# Patient Record
Sex: Male | Born: 1935 | Race: White | Hispanic: No | Marital: Married | Smoking: Former smoker
Health system: Southern US, Community
[De-identification: ages and names within clinical notes are randomized; demographics above are authoritative.]

## PROBLEM LIST (undated history)

## (undated) DIAGNOSIS — E785 Hyperlipidemia, unspecified: Secondary | ICD-10-CM

## (undated) DIAGNOSIS — I10 Essential (primary) hypertension: Secondary | ICD-10-CM

## (undated) DIAGNOSIS — Z8673 Personal history of transient ischemic attack (TIA), and cerebral infarction without residual deficits: Secondary | ICD-10-CM

## (undated) HISTORY — PX: KNEE ARTHROSCOPY: SHX127

## (undated) HISTORY — PX: INGUINAL HERNIA REPAIR: SHX194

## (undated) HISTORY — DX: Essential (primary) hypertension: I10

## (undated) HISTORY — PX: TRANSURETHRAL RESECTION OF PROSTATE: SHX73

## (undated) HISTORY — DX: Hyperlipidemia, unspecified: E78.5

## (undated) HISTORY — DX: Personal history of transient ischemic attack (TIA), and cerebral infarction without residual deficits: Z86.73

---

## 1999-06-15 ENCOUNTER — Ambulatory Visit (HOSPITAL_COMMUNITY): Admission: RE | Admit: 1999-06-15 | Discharge: 1999-06-15 | Payer: Self-pay | Admitting: Family Medicine

## 1999-06-15 ENCOUNTER — Encounter: Payer: Self-pay | Admitting: Family Medicine

## 2000-03-03 ENCOUNTER — Ambulatory Visit (HOSPITAL_COMMUNITY): Admission: RE | Admit: 2000-03-03 | Discharge: 2000-03-03 | Payer: Self-pay | Admitting: Family Medicine

## 2000-03-03 ENCOUNTER — Encounter: Payer: Self-pay | Admitting: Family Medicine

## 2000-06-29 ENCOUNTER — Ambulatory Visit (HOSPITAL_COMMUNITY): Admission: RE | Admit: 2000-06-29 | Discharge: 2000-06-29 | Payer: Self-pay | Admitting: Family Medicine

## 2000-06-29 ENCOUNTER — Encounter: Payer: Self-pay | Admitting: Family Medicine

## 2004-10-11 ENCOUNTER — Ambulatory Visit: Payer: Self-pay | Admitting: Internal Medicine

## 2004-12-13 ENCOUNTER — Ambulatory Visit: Payer: Self-pay | Admitting: Internal Medicine

## 2005-01-26 ENCOUNTER — Ambulatory Visit: Payer: Self-pay | Admitting: Internal Medicine

## 2005-05-04 ENCOUNTER — Ambulatory Visit: Payer: Self-pay | Admitting: Internal Medicine

## 2005-07-12 ENCOUNTER — Ambulatory Visit: Payer: Self-pay | Admitting: Gastroenterology

## 2005-07-29 ENCOUNTER — Ambulatory Visit: Payer: Self-pay | Admitting: Gastroenterology

## 2005-08-04 ENCOUNTER — Ambulatory Visit: Payer: Self-pay | Admitting: Internal Medicine

## 2005-08-17 ENCOUNTER — Ambulatory Visit: Payer: Self-pay | Admitting: Internal Medicine

## 2005-11-30 ENCOUNTER — Ambulatory Visit: Payer: Self-pay | Admitting: Internal Medicine

## 2006-03-03 ENCOUNTER — Ambulatory Visit: Payer: Self-pay | Admitting: Internal Medicine

## 2006-06-23 ENCOUNTER — Ambulatory Visit: Payer: Self-pay | Admitting: Internal Medicine

## 2006-08-04 ENCOUNTER — Ambulatory Visit: Payer: Self-pay | Admitting: Internal Medicine

## 2006-09-28 ENCOUNTER — Ambulatory Visit: Payer: Self-pay | Admitting: Internal Medicine

## 2007-01-10 ENCOUNTER — Ambulatory Visit: Payer: Self-pay | Admitting: Internal Medicine

## 2007-04-10 ENCOUNTER — Ambulatory Visit: Payer: Self-pay | Admitting: Internal Medicine

## 2007-08-03 ENCOUNTER — Ambulatory Visit: Payer: Self-pay | Admitting: Internal Medicine

## 2007-08-08 ENCOUNTER — Ambulatory Visit: Payer: Self-pay | Admitting: Internal Medicine

## 2007-08-23 ENCOUNTER — Ambulatory Visit: Payer: Self-pay | Admitting: Internal Medicine

## 2007-08-23 ENCOUNTER — Telehealth: Payer: Self-pay | Admitting: Internal Medicine

## 2007-08-25 DIAGNOSIS — J302 Other seasonal allergic rhinitis: Secondary | ICD-10-CM

## 2007-08-25 DIAGNOSIS — J4530 Mild persistent asthma, uncomplicated: Secondary | ICD-10-CM

## 2007-08-25 DIAGNOSIS — J3089 Other allergic rhinitis: Secondary | ICD-10-CM

## 2007-08-31 ENCOUNTER — Ambulatory Visit: Payer: Self-pay | Admitting: Internal Medicine

## 2007-11-15 ENCOUNTER — Ambulatory Visit: Payer: Self-pay | Admitting: Internal Medicine

## 2007-12-14 ENCOUNTER — Encounter: Payer: Self-pay | Admitting: Internal Medicine

## 2008-01-07 ENCOUNTER — Telehealth (INDEPENDENT_AMBULATORY_CARE_PROVIDER_SITE_OTHER): Payer: Self-pay | Admitting: *Deleted

## 2008-03-06 ENCOUNTER — Ambulatory Visit: Payer: Self-pay | Admitting: Internal Medicine

## 2008-06-25 ENCOUNTER — Ambulatory Visit: Payer: Self-pay | Admitting: Internal Medicine

## 2008-07-08 ENCOUNTER — Telehealth (INDEPENDENT_AMBULATORY_CARE_PROVIDER_SITE_OTHER): Payer: Self-pay | Admitting: *Deleted

## 2008-07-09 ENCOUNTER — Telehealth: Payer: Self-pay | Admitting: Internal Medicine

## 2008-08-01 ENCOUNTER — Ambulatory Visit: Payer: Self-pay | Admitting: Internal Medicine

## 2008-10-06 ENCOUNTER — Ambulatory Visit: Payer: Self-pay | Admitting: Internal Medicine

## 2009-04-22 ENCOUNTER — Ambulatory Visit: Payer: Self-pay | Admitting: Internal Medicine

## 2009-04-22 ENCOUNTER — Telehealth: Payer: Self-pay | Admitting: Internal Medicine

## 2009-07-31 ENCOUNTER — Ambulatory Visit: Payer: Self-pay | Admitting: Internal Medicine

## 2009-07-31 DIAGNOSIS — I10 Essential (primary) hypertension: Secondary | ICD-10-CM | POA: Insufficient documentation

## 2009-07-31 DIAGNOSIS — E785 Hyperlipidemia, unspecified: Secondary | ICD-10-CM | POA: Insufficient documentation

## 2009-08-17 ENCOUNTER — Telehealth: Payer: Self-pay | Admitting: Internal Medicine

## 2009-08-18 ENCOUNTER — Ambulatory Visit: Payer: Self-pay | Admitting: Internal Medicine

## 2009-10-06 ENCOUNTER — Emergency Department (HOSPITAL_COMMUNITY): Admission: EM | Admit: 2009-10-06 | Discharge: 2009-10-07 | Payer: Self-pay | Admitting: Emergency Medicine

## 2009-10-08 ENCOUNTER — Encounter: Admission: RE | Admit: 2009-10-08 | Discharge: 2009-10-08 | Payer: Self-pay | Admitting: Surgery

## 2009-11-20 ENCOUNTER — Ambulatory Visit: Payer: Self-pay | Admitting: Internal Medicine

## 2010-03-04 ENCOUNTER — Ambulatory Visit: Payer: Self-pay | Admitting: Internal Medicine

## 2010-06-09 ENCOUNTER — Ambulatory Visit: Payer: Self-pay | Admitting: Internal Medicine

## 2010-07-06 ENCOUNTER — Telehealth: Payer: Self-pay | Admitting: Internal Medicine

## 2010-07-08 ENCOUNTER — Telehealth (INDEPENDENT_AMBULATORY_CARE_PROVIDER_SITE_OTHER): Payer: Self-pay | Admitting: *Deleted

## 2010-07-16 ENCOUNTER — Telehealth (INDEPENDENT_AMBULATORY_CARE_PROVIDER_SITE_OTHER): Payer: Self-pay | Admitting: *Deleted

## 2010-07-30 ENCOUNTER — Ambulatory Visit: Payer: Self-pay | Admitting: Internal Medicine

## 2010-10-13 ENCOUNTER — Ambulatory Visit: Payer: Self-pay | Admitting: Internal Medicine

## 2010-10-21 NOTE — Progress Notes (Signed)
Summary: NEW MED/CB  Phone Note Call from Patient Call back at Home Phone [phone]   Caller: Patient Call For: YOUNG Summary of Call: WANTS SOMETHING TO REPLACE  THE ALLERGRA THAT HE WAS ON [facility] Initial call taken by: Lacinda Axon,  July 06, 2010 5:09 PM  Follow-up for Phone Call        pt states allegra is no longer covered by his insurance. Per pharmacists the only antihistamines that will be covered are xyzal or clarinex. Please advise. Carron Curie CMA  July 06, 2010 5:30 PM  per CY if insurance will cover Clarinex or xyzal then we can send rx but will not do a PA on either medication, pt willhave to get TOC. Rx for clarinex sent pt aware.Carron Curie CMA  July 06, 2010 5:39 PM     New/Updated Medications: CLARINEX 5 MG TABS (DESLORATADINE) Take 1 tablet by mouth once a day Prescriptions: CLARINEX 5 MG TABS (DESLORATADINE) Take 1 tablet by mouth once a day  #30 x 0   Entered by:   Carron Curie CMA   Authorized by:   Waymon Budge MD   Signed by:   Carron Curie CMA on 07/06/2010   Method used:   [facility]ically [facility]        [facility] [address]       [address]       [place], [place]  [address]       Ph: [phone] or [phone]       Fax: [phone]   RxID:   [id number]

## 2010-10-21 NOTE — Progress Notes (Signed)
Summary: prescription issue---requests 90 day supply instead of 30   Phone Note Call from Patient Call back at Home Phone [phone]   Caller: Spouse//[place] Call For: young Summary of Call: Wife states a 30-day supply of clarinex was called in for pt but he asked for 90-day supply, pls advise.//[facility] Initial call taken by: Darletta Moll,  July 08, 2010 1:40 PM  Follow-up for Phone Call        called and spoke with pt's wife.  wife states pt was supposed to get rx for 90 day supply on 07/06/2010 but was only given 30 day supply.  requests 90 day supply be called into pharmacy.  informed wife i would call new rx in for the 90 day supply.  Megan Reynolds LPN  July 08, 2010 3:11 PM     Prescriptions: CLARINEX 5 MG TABS (DESLORATADINE) Take 1 tablet by mouth once a day  #90 x 0   Entered by:   Arman Filter LPN   Authorized by:   Waymon Budge MD   Signed by:   Arman Filter LPN on 78/46/9629   Method used:   Telephoned to ...       [facility]  [place] [address] (retail)       [address]       [place], [place]  [address]       Ph: [phone] or [phone]       Fax: [phone]   RxID:   [id number]

## 2010-10-21 NOTE — Progress Notes (Signed)
Summary: meds clarification  Phone Note Call from Patient Call back at Home Phone [phone]   Caller: Patient Call For: young Summary of Call: pt has questions re: rx for clarinex. says when he was taking fexofenadine, he took 60mg  tab 2 x a day. clarinex rx is 5mg  once daily. call home # above or cell [phone] (try home # first).  Initial call taken by: Tivis Ringer, CNA,  July 16, 2010 2:56 PM  Follow-up for Phone Call        Spoke with patient- aware that Clarinex 5mg  should work just as well as the Fexofenadine 60mg -he should try it for at least 2 weeks and if any questions or concerns please call our office for further instructions from CDY.Reynaldo Minium CMA  July 16, 2010 3:15 PM

## 2010-10-21 NOTE — Assessment & Plan Note (Signed)
Summary: 29yr follow up/klw   Primary Provider/Referring Provider:  T[place]  CC:  yearly follow up--problems with his breathing per pt but he thinks this is all his allergies.  History of Present Illness: 08/01/08- Allergic rhinitis, asthma 1 yr f/u. Still occ wheeze, but less. Meds reviewed. Occ dry cough, somewhat seasonal but without much nasal congestion r rhinorhea. Had flu vax. Denies headache, chest pain, palpitation, edema.  July 31, 2009-  Allergic rhinitis, asthma Had flu vax and thinks more than one pneumovax. Having seasonal cough with some phlegm This Spring was better than usual. Uses Ricola lozenges. Uses Accupril for several years. Little dyspnea or sinus drip, no chest pain, occ peripheral edema if up all day or driving. We discussed CXR from 2 years ago.  July 30, 2010- Allergic rhinitis, asthma Nurse-CC: yearly follow up--problems with his breathing per pt but he thinks this is all his allergies Continues allergy vaccine. Uses Proair often as much as twice daily, partly precautionary. Continues Advair.Overall he sees no change in his  breathing over the past year. Very occasionally has needed a Z pak. .   Asthma History    Initial Asthma Severity Rating:    Age range: 12+ years    Symptoms: 0-2 days/week    Nighttime Awakenings: 0-2/month    Interferes w/ normal activity: no limitations    SABA use (not for EIB): daily    Asthma Severity Assessment: Moderate Persistent   Preventive Screening-Counseling & Management  Alcohol-Tobacco     Smoking Status: quit     Year Quit: 1953     Pack years: 2ppd x4 yrs  Allergies: 1)  ! Sulfa 2)  ! Codeine  Comments:  Nurse/Medical Assistant: The patient's medications and allergies were reviewed with the patient and were updated in the Medication and Allergy Lists.  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  Patient states former smoker as a teenager   Risk Factors: Smoking Status: quit (07/30/2010)  Review of Systems      See HPI       The patient complains of shortness of breath with activity and nasal congestion/difficulty breathing through nose.  The patient denies shortness of breath at rest, productive cough, non-productive cough, coughing up blood, chest pain, irregular heartbeats, acid heartburn, indigestion, loss of appetite, weight change, abdominal pain, difficulty swallowing, sore throat, tooth/dental problems, headaches, sneezing, itching, ear ache, rash, change in color of mucus, and fever.    Vital Signs:  Patient profile:   77 year old male Height:      73 inches Weight:      193.50 pounds BMI:     25.62 O2 Sat:      99 % on Room air Pulse rate:   66 / minute BP sitting:   132 / 84  (right arm) Cuff size:   regular  Vitals Entered By: Randell Loop CMA (July 30, 2010 11:36 AM)  O2 Sat at Rest %:  99 O2 Flow:  Room air CC: yearly follow up--problems with his breathing per pt but he thinks this is all his allergies Is Patient Diabetic?  No Pain Assessment Patient in pain? no      Comments meds updated today with pt   Physical Exam  Additional Exam:  General: A/Ox3; pleasant and cooperative, NAD, SKIN: no rash, lesions NODES: no lymphadenopathy HEENT: [place]/AT, EOM- WNL, Conjuctivae- clear, PERRLA, TM-WNL, Nose- clear, Throat- clear and wnl, dentures, Mallampati II NECK: Supple w/ fair ROM, JVD- none, normal carotid impulses w/o bruits Thyroid-   CHEST: Clear to P&A, no cough here, no rales or rhonchi HEART: RRR, no m/g/r heard ABDOMEN: Soft and nl; EAV:WUJW, nl pulses, no edema  NEURO: Grossly intact to observation      Impression & Recommendations:  Problem # 1:  ALLERGIC RHINITIS (ICD-477.9)  We discussed risk benefit and updated Epipen. Will let him try sample Patanase.  His updated medication list for this problem includes:    Clarinex 5 Mg Tabs (Desloratadine) .Marland Kitchen... Take 1 tablet by mouth once a day    Patanase 0.6 % Soln (Olopatadine hcl) .Marland Kitchen... 1-2 sprays each nostril two times a day if needed allergy  Problem # 2:  ASTHMA (ICD-493.90) Meds reviewed and refilled. He is using recue inhaler on a scheduled basis and we reiewed options. He has been stable enough that i will not ask him to change what works.  Medications Added to Medication List This Visit: 1)  Losartan Potassium 100 Mg Tabs (Losartan potassium) .... Take 1 tablet by mouth once a day 2)  Allergy Vaccine 1:10 Go  3)  Epipen 2-pak 0.3 Mg/0.19ml Devi (Epinephrine) .... For severe allergic reaction 4)  Patanase 0.6 % Soln (Olopatadine hcl) .Marland Kitchen.. 1-2 sprays each nostril two times a day if needed allergy  Other Orders: Est. Patient Level IV (11914)  Patient Instructions: 1)  Please schedule a follow-up appointment in 1 year. 2)  Continue present meds. 3)  Try sample/ script Patanase nasal antihistamine spray 4)  1-2 puffs each nostril twice daily if needed. 5)  Refills for Epipen and inhalers. Prescriptions: PROAIR HFA 108 (90 BASE) MCG/ACT  AERS (ALBUTEROL SULFATE) 2 puffs two times a day  #3 x 3   Entered and Authorized by:   Waymon Budge MD   Signed by:   Waymon Budge MD on 07/30/2010   Method used:   Print then Give to Patient   RxID:   [id number] ADVAIR DISKUS 250-50 MCG/DOSE  MISC (FLUTICASONE-SALMETEROL) 1 puff two times a day  #3 x 3   Entered and Authorized by:   Waymon Budge MD   Signed by:   Waymon Budge MD on 07/30/2010    Method used:   Print then Give to Patient   RxID:   [id number] PATANASE 0.6 % SOLN (OLOPATADINE HCL) 1-2 sprays each nostril two times a day if needed allergy  #1 x prn   Entered and Authorized by:   Waymon Budge MD   Signed by:   Waymon Budge MD on 07/30/2010   Method used:   Print then Give to Patient   RxID:   [id number] EPIPEN 2-PAK 0.3 MG/0.3ML DEVI (EPINEPHRINE) For severe allergic reaction  #1 x prn   Entered and Authorized by:   Waymon Budge MD   Signed by:   Waymon Budge MD on 07/30/2010   Method used:   Print then Give to Patient   RxID:   [id number]    Immunization History:  Influenza Immunization History:    Influenza:  historical (05/31/2010)

## 2010-10-22 ENCOUNTER — Telehealth (INDEPENDENT_AMBULATORY_CARE_PROVIDER_SITE_OTHER): Payer: Self-pay | Admitting: *Deleted

## 2010-11-04 NOTE — Progress Notes (Signed)
Summary: CLAIRINEX IS HIGH-atc na unable to leave msg  Phone Note Call from Patient Call back at [phone]   Caller: Patient Call For: YOUNG Summary of Call: INS IS GOING TO CHARGE 92 DOLLARS FOR 31 PILLS SAID THIS IS TOO HIGH THIS IS ON HIS CLAIRINEX Initial call taken by: Lacinda Axon,  October 22, 2010 2:10 PM  Follow-up for Phone Call        called and spoke with pt and he is just wanting to change from the clarinex to something much cheaper---he stated that he did buy some otc allegra--the allegra is not working as good as the clarinex..so he wanted to know what CY recs were.  please advise Randell Loop [facility]  October 22, 2010 4:46 PM   Additional Follow-up for Phone Call Additional follow up Details #1::        Otc Claritin/ loratadine  is chemically close to Clarinex and might work well. He might also like Zyrtec/ cetirizine otc. Additional Follow-up by: Waymon Budge MD,  October 25, 2010 1:31 PM    Additional Follow-up for Phone Call Additional follow up Details #2::    ATC pt back, NA and msg states that VM box has not been set up yet. WCB. Vernie Murders  October 25, 2010 2:14 PM  PT informed of Dr Roxy Cedar recommendations for OTC antihistamines. Abigail Miyamoto RN  October 26, 2010 9:10 AM

## 2010-12-06 LAB — DIFFERENTIAL
Basophils Relative: 1 % (ref 0–1)
Eosinophils Absolute: 0.1 10*3/uL (ref 0.0–0.7)
Lymphs Abs: 2.3 10*3/uL (ref 0.7–4.0)
Neutro Abs: 3.7 10*3/uL (ref 1.7–7.7)
Neutrophils Relative %: 55 % (ref 43–77)

## 2010-12-06 LAB — CBC
HCT: 47 % (ref 39.0–52.0)
Platelets: 169 10*3/uL (ref 150–400)
RDW: 13.7 % (ref 11.5–15.5)

## 2010-12-06 LAB — URINALYSIS, ROUTINE W REFLEX MICROSCOPIC
Bilirubin Urine: NEGATIVE
Hgb urine dipstick: NEGATIVE

## 2010-12-06 LAB — HEMOGLOBIN AND HEMATOCRIT, BLOOD
HCT: 48.2 % (ref 39.0–52.0)
Hemoglobin: 15.6 g/dL (ref 13.0–17.0)

## 2010-12-06 LAB — POCT I-STAT, CHEM 8
Chloride: 106 mEq/L (ref 96–112)
Glucose, Bld: 96 mg/dL (ref 70–99)
HCT: 53 % — ABNORMAL HIGH (ref 39.0–52.0)
Hemoglobin: 18 g/dL — ABNORMAL HIGH (ref 13.0–17.0)
Potassium: 4.3 mEq/L (ref 3.5–5.1)
Sodium: 140 mEq/L (ref 135–145)

## 2011-01-10 ENCOUNTER — Other Ambulatory Visit: Payer: Self-pay | Admitting: Internal Medicine

## 2011-01-11 ENCOUNTER — Other Ambulatory Visit: Payer: Self-pay | Admitting: Internal Medicine

## 2011-01-11 MED ORDER — AZITHROMYCIN 250 MG PO TABS: ORAL_TABLET | ORAL | Status: AC

## 2011-01-11 NOTE — Telephone Encounter (Signed)
Called spoke with sore throat, PND, sinus congestion, prod cough with yellow mucus x3days - denies f/c/s, SOB, wheezing.  Pt requesting zpak.  CDY please advise if okay to send.  [facility].  Allergies  Allergen Reactions  . Codeine   . Sulfonamide Derivatives

## 2011-01-11 NOTE — Telephone Encounter (Signed)
PHARMACY SENT FAX YESTERDAY.  PLEASE CALL PATIENT TO INFORM WHEN REFILL IS SENT.

## 2011-01-11 NOTE — Telephone Encounter (Signed)
OK to send a Z pak

## 2011-01-11 NOTE — Telephone Encounter (Signed)
Spoke with pharmacy and pt-called in Zpak #1 take as directed no refills. Pt aware Rx sent.

## 2011-01-14 IMAGING — CR DG CHEST 2V
3 series · 3 of 3 positions shown · non-contrast
Comparison: Chest x-ray 08/03/2007

CLINICAL DATA: Cough, hypertension, ex-smoker and a

CHEST - 2 VIEW

[view not recorded (1 of 3)]
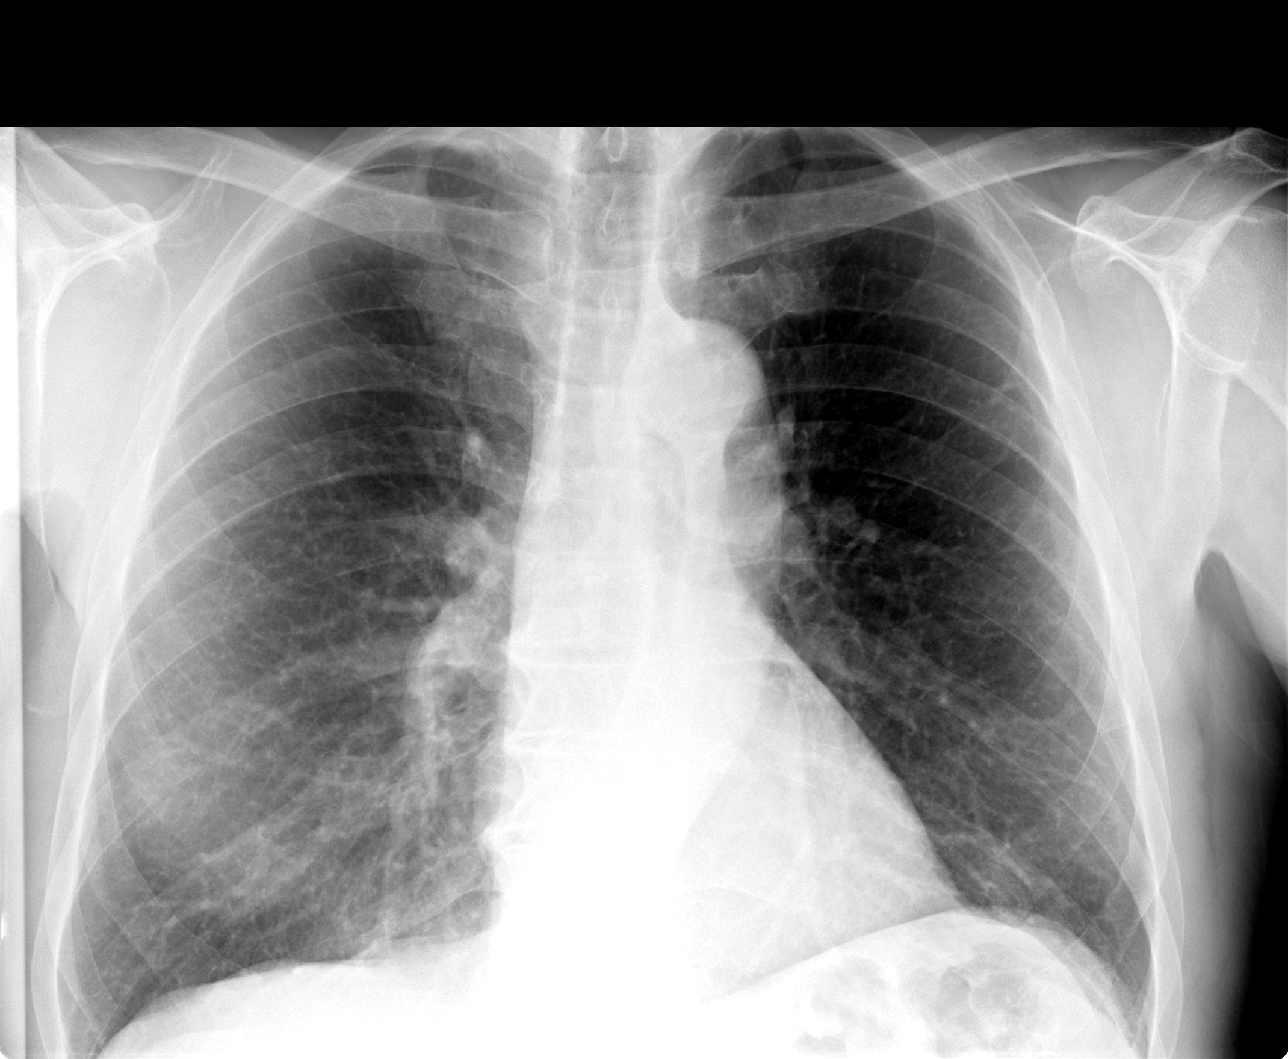

[view not recorded (2 of 3)]
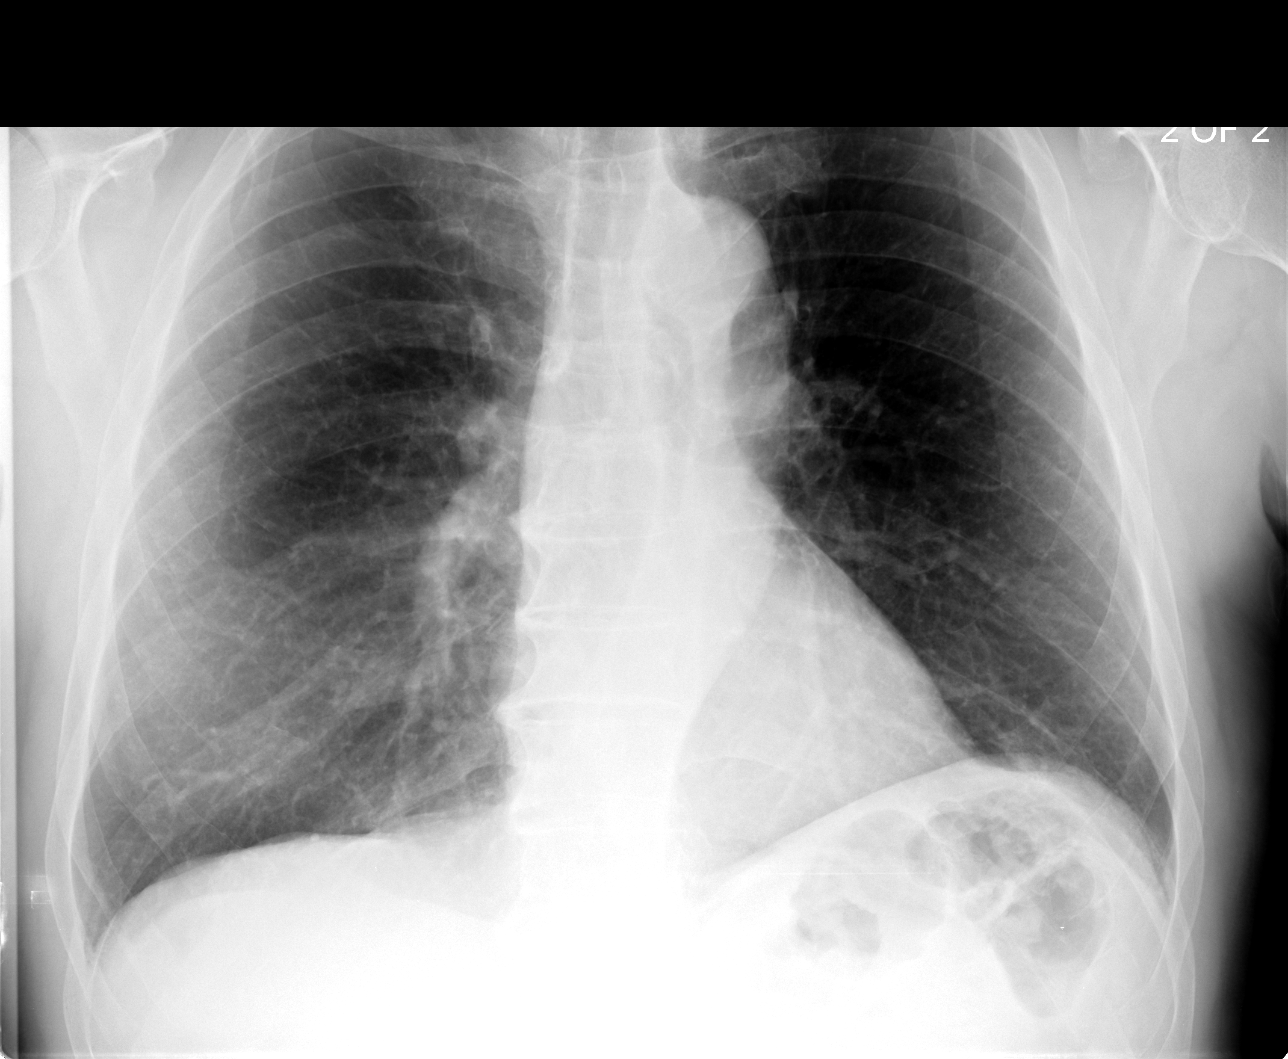

[view not recorded (3 of 3)]
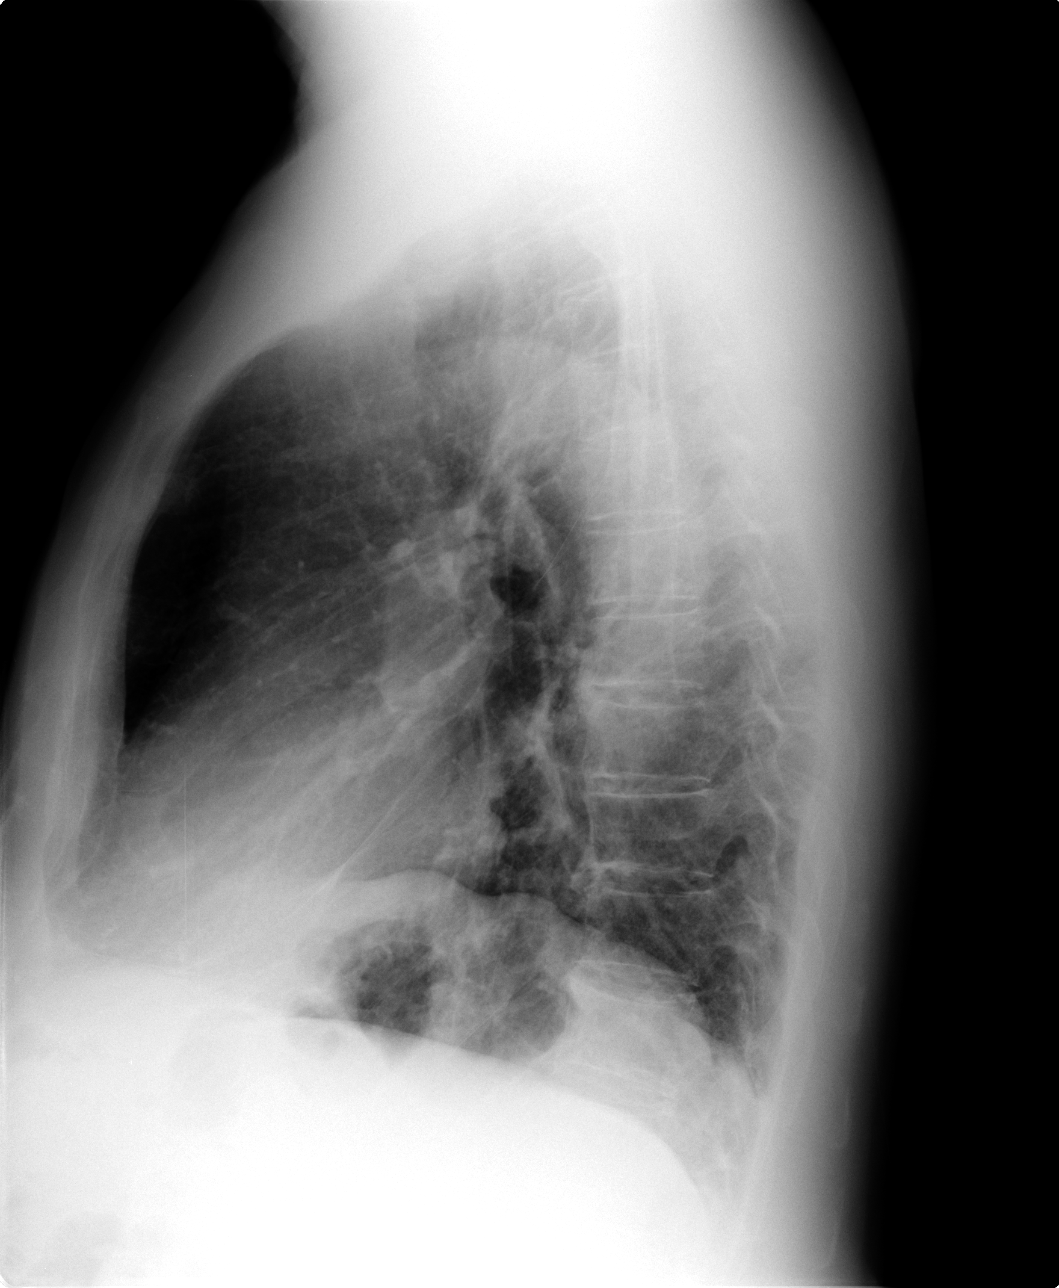

[3 of 3 positions shown; findings below may reference images not displayed]

FINDINGS: Heart size is normal and stable.  The thoracic aortic
contour is tortuous and stable.  Attenuation of the lung markings
near the apices and prominence of the retrosternal clear space
suggests underlying emphysema. Probable small calcified granuloma
versus focal scarring in the peripheral aspect of the left upper
lung is unchanged compared to the radiograph of 8990.  Minimal
pleural parenchymal scarring at the lung apices is also stable.  No
focal airspace disease, mass, or pleural effusion is identified.
Trachea is midline.  No acute osseous abnormality appreciated.
IMPRESSION: 1. Stable chest radiograph, with findings suggestive of COPD.
2. No acute findings.

## 2011-01-18 ENCOUNTER — Ambulatory Visit (INDEPENDENT_AMBULATORY_CARE_PROVIDER_SITE_OTHER): Payer: Self-pay

## 2011-01-18 DIAGNOSIS — J309 Allergic rhinitis, unspecified: Secondary | ICD-10-CM

## 2011-01-19 ENCOUNTER — Ambulatory Visit: Payer: Self-pay

## 2011-02-01 NOTE — Assessment & Plan Note (Signed)
[facility]                             PULMONARY OFFICE NOTE   WILL, HEINKEL                        MRN:          [id number]  DATE:08/03/2007                            DOB:          09-Jan-1931    PROBLEM LIST:  1. Allergic rhinitis.  2. Asthma.   HISTORY:  We called in a Z-Pak for him on November 13, for a bronchitis  exacerbation but otherwise he has done very well.  He notices a little  dry cough and we discussed Accupril.  He is going to ask Dr. Wylene Simmer  about changing that for trial.  He has had a flu vaccine.  Otherwise, he  is satisfied with current therapy, not wanting changes.   MEDICATIONS:  1. Allergy vaccine.  2. Advair 250/50.  3. ProAir HFA 2 puffs b.i.d. p.r.n.  4. Allegra 60 mg b.i.d. p.r.n.  5. Singulair 10 mg.  6. Accupril 20 mg.  7. Aspirin 81 mg every 2-3 days.  8. Aggrenox b.i.d.  9. Zocor 40 mg.   DRUG INTOLERANT:  1. SULFA.  2. CODEINE.   OBJECTIVE:  VITAL SIGNS:  Weight 191 pounds, BP 116/62, pulse 65, room  air saturation 100%.  LUNGS:  Clear with no coughing demonstrated while here.  HEART:  Sounds normal.  HEENT:  Conjunctivae and nasal mucosa look normal.   IMPRESSION:  1. Mild intermittent asthma.  2. Allergic rhinitis.   Old PFTs had shown normal spirometries.   PLAN:  1. Continue present therapy.  2. Talk with Dr. Wylene Simmer at up coming appointment about whether      Accupril could be changed to assess its effect on his dry cough.  3. Schedule chest x-ray.  4. Schedule PFT.  5. Schedule return 1 year, earlier p.r.n.     Clinton D. Maple Hudson, MD, Tonny Bollman, FACP  Electronically Signed    CDY/MedQ  DD: 08/04/2007  DT: 08/05/2007  Job #: [id number]   cc:   Gaspar Garbe, M.D.

## 2011-02-04 NOTE — Assessment & Plan Note (Signed)
[facility]                               PULMONARY OFFICE NOTE   JIRAIYA, MCEWAN                        MRN:          [id number]  DATE:08/04/2006                            DOB:          1931-09-07    PULMONARY/ALLERGY FOLLOWUP.   PROBLEMS:  1. Allergic rhinitis.  2. Asthma/chronic obstructive pulmonary disease.   HISTORY:  One-year followup.  He continues allergy vaccine at 1:10, giving  his own without problems.  We have reviewed issues of administration outside  of a medical office, anaphylaxis and epinephrine, and refilled his EpiPen.  We had called in a Z-Pak at his request on October 12 for a bronchitis  episode, and he says that worked fine.  He still had a little postnasal  drainage, which is enough to affect his sleep, but nothing purulent, no sore  throat.  He is not coughing or wheezing.   MEDICATIONS:  1. Advair 250/50.  2. Albuterol, used regularly 2 puffs b.i.d.  3. Allegra 60 mg b.i.d. p.r.n.  4. Singulair 10 mg.  5. Accupril 20 mg.  6. Zocor 10 mg.  7. Aspirin 81 mg.  8. Aggrenox 200/25 b.i.d.   ALLERGIES:  DRUG INTOLERANT OF SULFA AND CODEINE.   He had been a heavy smoker in the past, but had normal spirometry in 2004.   OBJECTIVE:  Weight 199 pounds, BP 122/80, pulse regular 65, room air  saturation 100% at rest.  He looks unlabored.  He has a long palate and thick tongue, which obscure the posterior pharynx  some, but there is no obvious postnasal drainage or erythema now, no  significant nasal congestion.  CHEST:  Quiet, breathing is unlabored.  HEART:  Sounds are regular and normal.   IMPRESSION:  1. Allergic rhinitis.  2. Asthma with chronic obstructive pulmonary disease.  Some of his      postnasal drip may still be postinflammatory.  I do not think he has a      sinusitis.   PLAN:  1. Try increasing his existing 60 mg Allegra to t.i.d., comparable to a      180 mg tablet.  2. Compare that to a sample  of Astelin 1 spray each nostril b.i.d. p.r.n.,      with discussion.  3. EpiPen was refilled, with discussion.  4. Schedule return in 1 year, earlier p.r.n.     Clinton D. Maple Hudson, MD, Tonny Bollman, FACP  Electronically Signed    CDY/MedQ  DD: 08/05/2006  DT: 08/05/2006  Job #: [id number]   cc:   Gaspar Garbe, M.D.

## 2011-04-14 ENCOUNTER — Telehealth: Payer: Self-pay | Admitting: Internal Medicine

## 2011-04-14 NOTE — Telephone Encounter (Signed)
Spoke with pt. He states having trouble affording advair since he is in the donut hole already. I advised will leave samples of med up front for pick up and he is going to call us back when he locates his drug formulary to see if there is a cheaper alternative.

## 2011-05-24 ENCOUNTER — Ambulatory Visit (INDEPENDENT_AMBULATORY_CARE_PROVIDER_SITE_OTHER): Payer: Self-pay

## 2011-05-24 DIAGNOSIS — J309 Allergic rhinitis, unspecified: Secondary | ICD-10-CM

## 2011-08-03 ENCOUNTER — Encounter: Payer: Self-pay | Admitting: Internal Medicine

## 2011-08-04 ENCOUNTER — Ambulatory Visit (INDEPENDENT_AMBULATORY_CARE_PROVIDER_SITE_OTHER): Payer: Medicare Other | Admitting: Internal Medicine

## 2011-08-04 ENCOUNTER — Encounter: Payer: Self-pay | Admitting: Internal Medicine

## 2011-08-04 VITALS — BP 130/60 | HR 60 | Ht 73.0 in | Wt 187.8 lb

## 2011-08-04 DIAGNOSIS — J45909 Unspecified asthma, uncomplicated: Secondary | ICD-10-CM

## 2011-08-04 DIAGNOSIS — G4700 Insomnia, unspecified: Secondary | ICD-10-CM

## 2011-08-04 DIAGNOSIS — J309 Allergic rhinitis, unspecified: Secondary | ICD-10-CM

## 2011-08-04 DIAGNOSIS — G4733 Obstructive sleep apnea (adult) (pediatric): Secondary | ICD-10-CM

## 2011-08-04 MED ORDER — FLUTICASONE-SALMETEROL 250-50 MCG/DOSE IN AEPB: 1.0000 | INHALATION_SPRAY | Freq: Two times a day (BID) | RESPIRATORY_TRACT | Status: DC

## 2011-08-04 MED ORDER — ALBUTEROL SULFATE HFA 108 (90 BASE) MCG/ACT IN AERS: 2.0000 | INHALATION_SPRAY | Freq: Four times a day (QID) | RESPIRATORY_TRACT | Status: DC | PRN

## 2011-08-04 MED ORDER — AZITHROMYCIN 250 MG PO TABS: ORAL_TABLET | ORAL | Status: AC

## 2011-08-04 NOTE — Patient Instructions (Addendum)
Sample Advair 250  Sample rescue inhaler if available  Ok to continue allergy vaccine  Script for Z pak antibiotic to hold in case needed for infection

## 2011-08-04 NOTE — Progress Notes (Signed)
08/04/11- 77 year old male former smoker followed for allergic rhinitis, asthma, complicated by history of CVA, hyperlipidemia, HBP LOV-07/30/2010 He has had flu vaccine. Felt well through much of the past year until 2 days ago when he had onset of nasal congestion and chest congestion he considers "seasonal". He has continued allergies vaccine without problems and believes it helps him. We discussed the cost effectiveness of Advair and his rescue inhaler. He uses Claritin when needed.  ROS-see HPI Constitutional:   No-   weight loss, night sweats, fevers, chills, fatigue, lassitude. HEENT:   No-  headaches, difficulty swallowing, tooth/dental problems, sore throat,       No-  sneezing, itching, ear ache, + recent nasal congestion, post nasal drip,  CV:  No-   chest pain, orthopnea, PND, swelling in lower extremities, anasarca,                                  dizziness, palpitations Resp: No- acute shortness of breath with exertion or at rest.              No-   productive cough,  No non-productive cough,  No- coughing up of blood.              No-   change in color of mucus.  No- wheezing.   Skin: No-   rash or lesions. GI:  No-   heartburn, indigestion, abdominal pain, nausea, vomiting, diarrhea,                 change in bowel habits, loss of appetite GU: No-   dysuria, change in color of urine, no urgency or frequency.  No- flank pain. MS:  No-   joint pain or swelling.  No- decreased range of motion.  No- back pain. Neuro-     nothing unusual Psych:  No- change in mood or affect. No depression or anxiety.  No memory loss.  OBJ General- Alert, Oriented, Affect-appropriate, Distress- none acute Skin- rash-none, lesions- none, excoriation- none Lymphadenopathy- none Head- atraumatic            Eyes- Gross vision intact, PERRLA, conjunctivae clear secretions            Ears- Hearing, canals-normal            Nose- Clear, no-Septal dev, mucus, polyps, erosion, perforation   Throat- Mallampati II , mucosa red , drainage- none, tonsils- atrophic Neck- flexible , trachea midline, no stridor , thyroid nl, carotid no bruit Chest - symmetrical excursion , unlabored           Heart/CV- RRR , no murmur , no gallop  , no rub, nl s1 s2                           - JVD- none , edema- none, stasis changes- none, varices- none           Lung- clear to P&A, wheeze- none, cough- none , dullness-none, rub- none           Chest wall-  Abd- tender-no, distended-no, bowel sounds-present, HSM- no Br/ Gen/ Rectal- Not done, not indicated Extrem- cyanosis- none, clubbing, none, atrophy- none, strength- nl Neuro- grossly intact to observation

## 2011-08-07 DIAGNOSIS — G4733 Obstructive sleep apnea (adult) (pediatric): Secondary | ICD-10-CM | POA: Insufficient documentation

## 2011-08-07 NOTE — Assessment & Plan Note (Signed)
He thinks it helps him to continue allergy vaccine.

## 2011-08-07 NOTE — Assessment & Plan Note (Signed)
He had smoked for a long time so there is assumed to be underlying COPD. What he feels is episodic tightening, either an exacerbation of bronchitis or a very mild asthma component. At this time of year it is hard to tell whether weather change or a mild virus is the trigger. He will continue his allergy vaccine. I have discussed use of Mucinex and his inhalers. Given Z-Pak to hold.

## 2011-08-31 ENCOUNTER — Ambulatory Visit (INDEPENDENT_AMBULATORY_CARE_PROVIDER_SITE_OTHER): Payer: Medicare Other

## 2011-08-31 DIAGNOSIS — J309 Allergic rhinitis, unspecified: Secondary | ICD-10-CM

## 2011-09-22 ENCOUNTER — Encounter (HOSPITAL_COMMUNITY): Payer: Self-pay | Admitting: *Deleted

## 2011-09-22 ENCOUNTER — Emergency Department (HOSPITAL_COMMUNITY)
Admission: EM | Admit: 2011-09-22 | Discharge: 2011-09-22 | Disposition: A | Discharge status: Home / Self Care | Payer: Medicare Other | Attending: Emergency Medicine | Admitting: Emergency Medicine

## 2011-09-22 ENCOUNTER — Emergency Department (HOSPITAL_COMMUNITY): Payer: Medicare Other

## 2011-09-22 DIAGNOSIS — Z8673 Personal history of transient ischemic attack (TIA), and cerebral infarction without residual deficits: Secondary | ICD-10-CM | POA: Insufficient documentation

## 2011-09-22 DIAGNOSIS — I10 Essential (primary) hypertension: Secondary | ICD-10-CM | POA: Insufficient documentation

## 2011-09-22 DIAGNOSIS — R51 Headache: Secondary | ICD-10-CM | POA: Insufficient documentation

## 2011-09-22 DIAGNOSIS — M47812 Spondylosis without myelopathy or radiculopathy, cervical region: Secondary | ICD-10-CM | POA: Insufficient documentation

## 2011-09-22 LAB — COMPREHENSIVE METABOLIC PANEL
ALT: 16 U/L (ref 0–53)
Albumin: 3.2 g/dL — ABNORMAL LOW (ref 3.5–5.2)
Alkaline Phosphatase: 81 U/L (ref 39–117)
BUN: 9 mg/dL (ref 6–23)
Creatinine, Ser: 0.71 mg/dL (ref 0.50–1.35)
GFR calc Af Amer: >90 mL/min (ref >90)
Sodium: 138 mEq/L (ref 135–145)
Total Bilirubin: 0.3 mg/dL (ref 0.3–1.2)

## 2011-09-22 LAB — CBC
MCV: 87.7 fL (ref 78.0–100.0)
Platelets: 201 10*3/uL (ref 150–400)
RBC: 4.97 MIL/uL (ref 4.22–5.81)
WBC: 7.8 10*3/uL (ref 4.0–10.5)

## 2011-09-22 LAB — DIFFERENTIAL
Basophils Relative: 0 % (ref 0–1)
Eosinophils Absolute: 0.1 10*3/uL (ref 0.0–0.7)
Eosinophils Relative: 2 % (ref 0–5)
Lymphs Abs: 1.7 10*3/uL (ref 0.7–4.0)
Monocytes Relative: 15 % — ABNORMAL HIGH (ref 3–12)
Neutrophils Relative %: 61 % (ref 43–77)

## 2011-09-22 MED ORDER — ORPHENADRINE CITRATE ER 100 MG PO TB12: 100.0000 mg | ORAL_TABLET | Freq: Two times a day (BID) | ORAL | Status: AC

## 2011-09-22 MED ORDER — HYDROMORPHONE HCL PF 1 MG/ML IJ SOLN
1.0000 mg | Freq: Once | INTRAMUSCULAR | Status: AC
  Administered 2011-09-22: 1 mg via INTRAVENOUS
  Filled 2011-09-22: qty 1

## 2011-09-22 MED ORDER — PREDNISONE 50 MG PO TABS: 50.0000 mg | ORAL_TABLET | Freq: Every day | ORAL | Status: DC

## 2011-09-22 MED ORDER — DIPHENHYDRAMINE HCL 50 MG/ML IJ SOLN
25.0000 mg | Freq: Once | INTRAMUSCULAR | Status: AC
  Administered 2011-09-22: 25 mg via INTRAVENOUS
  Filled 2011-09-22: qty 1

## 2011-09-22 MED ORDER — METHYLPREDNISOLONE SODIUM SUCC 125 MG IJ SOLR
125.0000 mg | Freq: Once | INTRAMUSCULAR | Status: AC
  Administered 2011-09-22: 125 mg via INTRAVENOUS
  Filled 2011-09-22: qty 2

## 2011-09-22 MED ORDER — ONDANSETRON HCL 4 MG/2ML IJ SOLN
4.0000 mg | Freq: Once | INTRAMUSCULAR | Status: AC
  Administered 2011-09-22: 4 mg via INTRAVENOUS
  Filled 2011-09-22: qty 2

## 2011-09-22 MED ORDER — NAPROXEN 375 MG PO TABS: 375.0000 mg | ORAL_TABLET | Freq: Two times a day (BID) | ORAL | Status: AC

## 2011-09-22 MED ORDER — HYDROMORPHONE HCL 2 MG PO TABS: 2.0000 mg | ORAL_TABLET | ORAL | Status: AC | PRN

## 2011-09-22 MED ORDER — METOCLOPRAMIDE HCL 5 MG/ML IJ SOLN
10.0000 mg | Freq: Once | INTRAMUSCULAR | Status: AC
  Administered 2011-09-22: 10 mg via INTRAVENOUS
  Filled 2011-09-22: qty 2

## 2011-09-22 NOTE — ED Notes (Signed)
Pt encouraged to go to CT

## 2011-09-22 NOTE — ED Notes (Signed)
Patient transported to CT 

## 2011-09-22 NOTE — ED Notes (Signed)
Patient given discharge instructions, information, prescriptions, and diet order. Patient states that they adequately understand discharge information given and to return to ED if symptoms return or worsen.    Pt given 5 prescriptions. Family escorting pt home.

## 2011-09-22 NOTE — ED Provider Notes (Signed)
History     CSN: [id number]  Arrival date & time 09/22/11  1431   First MD Initiated Contact with Patient 09/22/11 1619      Chief Complaint  Patient presents with  . Headache    Per EMS pt reports taking a medication last night for the first time and headache since.     (Consider location/radiation/quality/duration/timing/severity/associated sxs/prior treatment) Patient is a 78 y.o. male presenting with headaches. The history is provided by the patient.  Headache   He has been having pain in his head for the last 2 months. He was told was from arthritis in his neck and has been seeing a chiropractor who has been giving him adjustments with some temporary relief. He was doing reasonably well until 4 AM today when he had sudden onset of his fingers, sharp pain which was throughout his head but not involving his face. Pain is 10 out of 10, and nothing has made it better. Pain is worse with any head movement. The pain actually subsided a little bit is able to drive for several hours to get back to [place], but now has gotten much worse.Marland Kitchen  Past Medical History  Diagnosis Date  . Asthma   . Allergic rhinitis   . History of CVA (cerebrovascular accident)   . Hyperlipidemia   . HTN (hypertension)     Past Surgical History  Procedure Date  . Knee arthroscopy     bilaterol  . Inguinal hernia repair   . Transurethral resection of prostate     Family History  Problem Relation Age of Onset  . Cancer Mother   . Cancer Father   . Cancer Other     sibling  . Heart disease Other     sibling    History  Substance Use Topics  . Smoking status: Former Games developer  . Smokeless tobacco: Not on file   Comment: as a teenager  . Alcohol Use: Not on file      Review of Systems  Neurological: Positive for headaches.  All other systems reviewed and are negative.    Allergies  Yeast-related products; Codeine; and Sulfonamide derivatives  Home Medications   Current Outpatient Rx    Name Route Sig Dispense Refill  . ALBUTEROL SULFATE HFA 108 (90 BASE) MCG/ACT IN AERS Inhalation Inhale 2 puffs into the lungs every 6 (six) hours as needed. 1 Inhaler prn  . CLOPIDOGREL BISULFATE 75 MG PO TABS Oral Take 1 tablet by mouth daily.    . DESLORATADINE 5 MG PO TABS Oral Take 5 mg by mouth daily.      Marland Kitchen EPINEPHRINE 0.3 MG/0.3ML IJ DEVI  Use once as needed for severe allergic reaction     . FLUTICASONE-SALMETEROL 250-50 MCG/DOSE IN AEPB Inhalation Inhale 1 puff into the lungs every 12 (twelve) hours. 60 each prn  . LOSARTAN POTASSIUM 100 MG PO TABS Oral Take 100 mg by mouth daily.      Marland Kitchen SIMVASTATIN 40 MG PO TABS Oral Take 40 mg by mouth at bedtime.        BP 144/86  Pulse 71  Temp(Src) 97.5 F (36.4 C) (Oral)  Resp 16  SpO2 100%  Physical Exam  Nursing note and vitals reviewed.  78 year old male who appears mildly uncomfortable. Vital signs show mild systolic hypertension with blood pressure 144 race 6. Oxygen saturation is 100% which is normal. Head is normocephalic and atraumatic. Scalp is nontender. PERRLA, EOMI. Oropharynx is clear. Neck has severe tenderness at the uppers cervical  spine and left para cervical muscles near the insertion to the occiput. There is moderate spasm of the paracervical musculature. There is pain with any movement of the head in any of the 3 directions of movement. Back is nontender. Lungs are clear without rales, wheezes, rhonchi. Heart has regular rate rhythm without murmur. Abdomen is soft, flat, nontender without masses or hepatosplenomegaly. Extremities no cyanosis or edema, full range of motion is present. Skin is warm and moist without rash. Neurologic: Mental status is normal, cranial nerves are intact, there no focal motor or sensory deficits. There is no pronator drift. Psychiatric: No abnormalities of mood or affect.  ED Course  Procedures (including critical care time)   Labs Reviewed  I-STAT TROPONIN I  COMPREHENSIVE METABOLIC PANEL   CBC  DIFFERENTIAL  SEDIMENTATION RATE   No results found.   No diagnosis found.  1720: He was given some IV fluids and IV Reglan and Benadryl with slight relief. Pain is down to 9/10. He was given hydromorphone and Zofran.  1830: He is feeling significantly better after Dilaudid and Zofran. CT does confirm significant degenerative changes. 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 He will be given a dose of Solu-Medrol and sent home with prescriptions for naproxen, Norflex, prednisone, and hydromorphone.  MDM  Hea penicillin,dache which appears to be of musculoskeletal origin with significant paracervical spasm.        Dione Booze, MD 09/22/11 [phone]

## 2011-09-22 NOTE — ED Notes (Signed)
O2 applied on recommendation of pts son for tx of headache.

## 2011-09-22 NOTE — ED Notes (Signed)
MWN:UUVOZ<DG> Expected date:09/22/11<BR> Expected time: 2:34 PM<BR> Means of arrival:Ambulance<BR> Comments:<BR> EMS 2 RK, 80 yom headache

## 2011-12-07 ENCOUNTER — Ambulatory Visit (INDEPENDENT_AMBULATORY_CARE_PROVIDER_SITE_OTHER): Payer: Medicare Other

## 2011-12-07 DIAGNOSIS — J309 Allergic rhinitis, unspecified: Secondary | ICD-10-CM

## 2011-12-27 ENCOUNTER — Other Ambulatory Visit: Payer: Self-pay | Admitting: Internal Medicine

## 2011-12-27 NOTE — Telephone Encounter (Signed)
Please advise if okay to refill for patient. Thanks.

## 2011-12-28 NOTE — Telephone Encounter (Signed)
Ok to fill 

## 2011-12-29 NOTE — Telephone Encounter (Signed)
rx was refilled 

## 2012-04-02 ENCOUNTER — Ambulatory Visit (INDEPENDENT_AMBULATORY_CARE_PROVIDER_SITE_OTHER): Payer: Medicare Other

## 2012-04-02 DIAGNOSIS — J309 Allergic rhinitis, unspecified: Secondary | ICD-10-CM

## 2012-05-22 ENCOUNTER — Telehealth: Payer: Self-pay | Admitting: Internal Medicine

## 2012-05-22 NOTE — Telephone Encounter (Signed)
Okay to give 1 sample of each if we have them.

## 2012-05-22 NOTE — Telephone Encounter (Signed)
I spoke with pt and is aware 1 sample of each has been left upfront for pick up. Nothing further was needed

## 2012-05-22 NOTE — Telephone Encounter (Signed)
Pt wants samples of advair 250-50 and proair. Last seen 07/25/11 f/u 07/2012. Please advise if okay thanks

## 2012-07-11 ENCOUNTER — Ambulatory Visit (INDEPENDENT_AMBULATORY_CARE_PROVIDER_SITE_OTHER): Payer: Medicare Other

## 2012-07-11 DIAGNOSIS — J309 Allergic rhinitis, unspecified: Secondary | ICD-10-CM

## 2012-08-09 ENCOUNTER — Encounter: Payer: Self-pay | Admitting: Internal Medicine

## 2012-08-09 ENCOUNTER — Ambulatory Visit (INDEPENDENT_AMBULATORY_CARE_PROVIDER_SITE_OTHER): Payer: Medicare Other | Admitting: Internal Medicine

## 2012-08-09 VITALS — BP 112/74 | HR 62 | Ht 73.0 in | Wt 187.4 lb

## 2012-08-09 DIAGNOSIS — J309 Allergic rhinitis, unspecified: Secondary | ICD-10-CM

## 2012-08-09 DIAGNOSIS — J45909 Unspecified asthma, uncomplicated: Secondary | ICD-10-CM

## 2012-08-09 DIAGNOSIS — J45998 Other asthma: Secondary | ICD-10-CM

## 2012-08-09 MED ORDER — EPINEPHRINE 0.3 MG/0.3ML IJ DEVI: 0.3000 mg | Freq: Once | INTRAMUSCULAR | Status: DC

## 2012-08-09 MED ORDER — ALBUTEROL SULFATE HFA 108 (90 BASE) MCG/ACT IN AERS: 2.0000 | INHALATION_SPRAY | Freq: Four times a day (QID) | RESPIRATORY_TRACT | Status: DC | PRN

## 2012-08-09 MED ORDER — MOMETASONE FURO-FORMOTEROL FUM 100-5 MCG/ACT IN AERO: INHALATION_SPRAY | RESPIRATORY_TRACT | Status: DC

## 2012-08-09 MED ORDER — MOMETASONE FURO-FORMOTEROL FUM 100-5 MCG/ACT IN AERO: 2.0000 | INHALATION_SPRAY | Freq: Two times a day (BID) | RESPIRATORY_TRACT | Status: DC

## 2012-08-09 NOTE — Patient Instructions (Addendum)
Refill script for rescue inhaler Proair  Refill script for Epipen to have in case of severe allergic reaction while on allergy shots  We can continue the allergy vaccine at 1:10  Script and sample Dulera 100     Maintenance controller inhaler   2 puffs then rinse mouth twice daily     Compare this to Advair  Please call as needed

## 2012-08-09 NOTE — Progress Notes (Signed)
08/04/11- 78 year old male former smoker followed for allergic rhinitis, asthma, complicated by history of CVA, hyperlipidemia, HBP LOV-07/30/2010 He has had flu vaccine. Felt well through much of the past year until 2 days ago when he had onset of nasal congestion and chest congestion he considers "seasonal". He has continued allergies vaccine without problems and believes it helps him. We discussed the cost effectiveness of Advair and his rescue inhaler. He uses Claritin when needed.  08/09/12- 78 year old male former smoker followed for allergic rhinitis, asthma, complicated by history of CVA, hyperlipidemia, HBP Pt states breathing is doing well, denies any new co's today. He continues allergy vaccine 1:10 GO with no problems. He feels it helps. This has been a good year with little asthma. Occasional minor wheeze. He continues Advair 250 twice daily with occasional use of rescue inhaler.  ROS-see HPI Constitutional:   No-   weight loss, night sweats, fevers, chills, fatigue, lassitude. HEENT:   No-  headaches, difficulty swallowing, tooth/dental problems, sore throat,       No-  sneezing, itching, ear ache, + recent nasal congestion, post nasal drip,  CV:  No-   chest pain, orthopnea, PND, swelling in lower extremities, anasarca,  dizziness, palpitations Resp: No- acute shortness of breath with exertion or at rest.              No-   productive cough,  No non-productive cough,  No- coughing up of blood.              No-   change in color of mucus.  No- wheezing.   Skin: No-   rash or lesions. GI:  No-   heartburn, indigestion, abdominal pain, nausea, vomiting,  GU: MS:  No-   joint pain or swelling.   Neuro-     nothing unusual Psych:  No- change in mood or affect. No depression or anxiety.  No memory loss.  OBJ General- Alert, Oriented, Affect-appropriate, Distress- none acute. Very sharp. Skin- rash-none, lesions- none, excoriation- none Lymphadenopathy- none Head- atraumatic        Eyes- Gross vision intact, PERRLA, conjunctivae clear secretions            Ears- Hearing, canals-normal            Nose- Clear, no-Septal dev, mucus, polyps, erosion, perforation             Throat- Mallampati II , mucosa red , drainage- none, tonsils- atrophic Neck- flexible , trachea midline, no stridor , thyroid nl, carotid no bruit Chest - symmetrical excursion , unlabored           Heart/CV- RRR , no murmur , no gallop  , no rub, nl s1 s2                           - JVD- none , edema- none, stasis changes- none, varices- none           Lung- clear to P&A, wheeze- none, cough- none , dullness-none, rub- none           Chest wall-  Abd-  Br/ Gen/ Rectal- Not done, not indicated Extrem- cyanosis- none, clubbing, none, atrophy- none, strength- nl Neuro- grossly intact to observation

## 2012-08-17 ENCOUNTER — Encounter: Payer: Self-pay | Admitting: Internal Medicine

## 2012-08-17 NOTE — Assessment & Plan Note (Signed)
We can continue allergy vaccine. Plan-refill EpiPen. Risk-benefit discussion.

## 2012-08-17 NOTE — Assessment & Plan Note (Signed)
Generally good control. We discussed alternatives to Advair noting financial concern. Plan-start Dulera 102 puffs twice daily.

## 2012-10-10 ENCOUNTER — Telehealth: Payer: Self-pay | Admitting: Internal Medicine

## 2012-10-10 ENCOUNTER — Other Ambulatory Visit: Payer: Self-pay | Admitting: Internal Medicine

## 2012-10-10 MED ORDER — MOMETASONE FURO-FORMOTEROL FUM 100-5 MCG/ACT IN AERO: 2.0000 | INHALATION_SPRAY | Freq: Two times a day (BID) | RESPIRATORY_TRACT | Status: DC

## 2012-10-10 MED ORDER — "TUBERCULIN-ALLERGY SYRINGES 28G X 1/2"" 1 ML MISC": Status: DC

## 2012-10-10 MED ORDER — FLUTICASONE-SALMETEROL 250-50 MCG/DOSE IN AEPB: 1.0000 | INHALATION_SPRAY | Freq: Two times a day (BID) | RESPIRATORY_TRACT | Status: DC

## 2012-10-10 NOTE — Telephone Encounter (Signed)
Pt is aware that samples are ready to be picked up. 

## 2012-10-19 ENCOUNTER — Other Ambulatory Visit: Payer: Self-pay | Admitting: Internal Medicine

## 2012-10-31 ENCOUNTER — Ambulatory Visit (INDEPENDENT_AMBULATORY_CARE_PROVIDER_SITE_OTHER): Payer: Medicare Other

## 2012-10-31 DIAGNOSIS — J309 Allergic rhinitis, unspecified: Secondary | ICD-10-CM

## 2012-12-03 ENCOUNTER — Telehealth: Payer: Self-pay | Admitting: Internal Medicine

## 2012-12-03 MED ORDER — FLUTICASONE-SALMETEROL 250-50 MCG/DOSE IN AEPB: 1.0000 | INHALATION_SPRAY | Freq: Two times a day (BID) | RESPIRATORY_TRACT | Status: DC

## 2012-12-03 MED ORDER — MOMETASONE FURO-FORMOTEROL FUM 100-5 MCG/ACT IN AERO: 2.0000 | INHALATION_SPRAY | Freq: Two times a day (BID) | RESPIRATORY_TRACT | Status: DC

## 2012-12-03 NOTE — Telephone Encounter (Signed)
Please advise if okay to provide samples of proair, advair, and dulera thanks Last ov 08/09/12 Next ov 08/09/13  Allergies  Allergen Reactions  . Codeine Anaphylaxis  . Sulfonamide Derivatives Anaphylaxis  . Yeast-Related Products Rash

## 2012-12-03 NOTE — Telephone Encounter (Signed)
Spoke with pt and notified of recs per CDY He verbalized understanding 1 box dulera and 1 box advair up front, no proair at this time

## 2012-12-03 NOTE — Telephone Encounter (Signed)
Per CY-okay to give one sample of each of inhalers(if we have them); he will need to figure out long term supply-maybe patient assistance or he can contact the company directly.

## 2013-02-08 ENCOUNTER — Ambulatory Visit (INDEPENDENT_AMBULATORY_CARE_PROVIDER_SITE_OTHER): Payer: Medicare Other

## 2013-02-08 DIAGNOSIS — J309 Allergic rhinitis, unspecified: Secondary | ICD-10-CM

## 2013-02-11 ENCOUNTER — Encounter (HOSPITAL_COMMUNITY): Payer: Self-pay | Admitting: *Deleted

## 2013-02-11 ENCOUNTER — Emergency Department (HOSPITAL_COMMUNITY)
Admission: EM | Admit: 2013-02-11 | Discharge: 2013-02-11 | Disposition: A | Discharge status: Home / Self Care | Payer: Medicare Other | Attending: Emergency Medicine | Admitting: Emergency Medicine

## 2013-02-11 DIAGNOSIS — R531 Weakness: Secondary | ICD-10-CM

## 2013-02-11 DIAGNOSIS — J45909 Unspecified asthma, uncomplicated: Secondary | ICD-10-CM | POA: Insufficient documentation

## 2013-02-11 DIAGNOSIS — Z8673 Personal history of transient ischemic attack (TIA), and cerebral infarction without residual deficits: Secondary | ICD-10-CM | POA: Insufficient documentation

## 2013-02-11 DIAGNOSIS — Z862 Personal history of diseases of the blood and blood-forming organs and certain disorders involving the immune mechanism: Secondary | ICD-10-CM | POA: Insufficient documentation

## 2013-02-11 DIAGNOSIS — E785 Hyperlipidemia, unspecified: Secondary | ICD-10-CM | POA: Insufficient documentation

## 2013-02-11 DIAGNOSIS — Z79899 Other long term (current) drug therapy: Secondary | ICD-10-CM | POA: Insufficient documentation

## 2013-02-11 DIAGNOSIS — I10 Essential (primary) hypertension: Secondary | ICD-10-CM | POA: Insufficient documentation

## 2013-02-11 DIAGNOSIS — R55 Syncope and collapse: Secondary | ICD-10-CM | POA: Insufficient documentation

## 2013-02-11 DIAGNOSIS — M7989 Other specified soft tissue disorders: Secondary | ICD-10-CM | POA: Insufficient documentation

## 2013-02-11 DIAGNOSIS — Z87891 Personal history of nicotine dependence: Secondary | ICD-10-CM | POA: Insufficient documentation

## 2013-02-11 DIAGNOSIS — Z8639 Personal history of other endocrine, nutritional and metabolic disease: Secondary | ICD-10-CM | POA: Insufficient documentation

## 2013-02-11 DIAGNOSIS — H538 Other visual disturbances: Secondary | ICD-10-CM | POA: Insufficient documentation

## 2013-02-11 LAB — CBC WITH DIFFERENTIAL/PLATELET
Basophils Absolute: 0 10*3/uL (ref 0.0–0.1)
Basophils Relative: 0 % (ref 0–1)
Eosinophils Absolute: 0.1 K/uL (ref 0.0–0.7)
Eosinophils Relative: 1 % (ref 0–5)
HCT: 43 % (ref 39.0–52.0)
Hemoglobin: 14.7 g/dL (ref 13.0–17.0)
Lymphocytes Relative: 13 % (ref 12–46)
Lymphs Abs: 1.3 10*3/uL (ref 0.7–4.0)
MCH: 29.7 pg (ref 26.0–34.0)
MCHC: 34.2 g/dL (ref 30.0–36.0)
MCV: 86.9 fL (ref 78.0–100.0)
Monocytes Absolute: 0.8 K/uL (ref 0.1–1.0)
Monocytes Relative: 8 % (ref 3–12)
Neutro Abs: 8.2 10*3/uL — ABNORMAL HIGH (ref 1.7–7.7)
Neutrophils Relative %: 79 % — ABNORMAL HIGH (ref 43–77)
Platelets: 176 10*3/uL (ref 150–400)
RBC: 4.95 MIL/uL (ref 4.22–5.81)
RDW: 13.9 % (ref 11.5–15.5)
WBC: 10.4 10*3/uL (ref 4.0–10.5)

## 2013-02-11 LAB — POCT I-STAT TROPONIN I
Troponin i, poc: 0.01 ng/mL (ref 0.00–0.08)
Troponin i, poc: 0.01 ng/mL (ref 0.00–0.08)

## 2013-02-11 LAB — BASIC METABOLIC PANEL
CO2: 29 mEq/L (ref 19–32)
Calcium: 10.1 mg/dL (ref 8.4–10.5)
Chloride: 97 mEq/L (ref 96–112)
Glucose, Bld: 99 mg/dL (ref 70–99)
Sodium: 134 mEq/L — ABNORMAL LOW (ref 135–145)

## 2013-02-11 LAB — BASIC METABOLIC PANEL WITH GFR
BUN: 17 mg/dL (ref 6–23)
Creatinine, Ser: 1.24 mg/dL (ref 0.50–1.35)
GFR calc Af Amer: 62 mL/min — ABNORMAL LOW (ref >90)
GFR calc non Af Amer: 54 mL/min — ABNORMAL LOW (ref >90)
Potassium: 4.2 meq/L (ref 3.5–5.1)

## 2013-02-11 MED ORDER — SODIUM CHLORIDE 0.9 % IV BOLUS (SEPSIS)
500.0000 mL | Freq: Once | INTRAVENOUS | Status: AC
  Administered 2013-02-11: 500 mL via INTRAVENOUS

## 2013-02-11 NOTE — ED Notes (Signed)
Family at bedside. 

## 2013-02-11 NOTE — ED Provider Notes (Signed)
History     CSN: [id number]  Arrival date & time 02/11/13  1152   First MD Initiated Contact with Patient 02/11/13 1237      Chief Complaint  Patient presents with  . Near Syncope    (Consider location/radiation/quality/duration/timing/severity/associated sxs/prior treatment) HPI Comments: Patient with history of prior stroke almost 14 years ago, history of hypertension, had his medications adjusted approximately 6 weeks ago, adding amlodipine. Do to swelling in his legs, an additional diuretic was then added approximately 3 weeks ago. Patient reports a couple of weeks ago, the patient had a near syncopal event while working in the yard pulling weeds. This morning while working outside very lightly with his 2 sons, measuring areas on the ground which involved some bending and stooping over and standing up, the patient became rather acutely dizzy, became diaphoretic. He denies feeling faint, simply reports feeling dizzy. He denies vertigo symptoms. He did have some transient blurred vision which is resolved at this time. He reports he did have some tightness across his upper shoulders. He denies any chest pain, shortness of breath, nausea or vomiting. He denies diarrhea or any recent loose stools. Patient reports that he feels much improved and all symptoms seem to have resolved. he denies any recent fever, chills, URI symptoms.  The history is provided by the patient and a relative.    Past Medical History  Diagnosis Date  . Asthma   . Allergic rhinitis   . History of CVA (cerebrovascular accident)   . Hyperlipidemia   . HTN (hypertension)     Past Surgical History  Procedure Laterality Date  . Knee arthroscopy      bilaterol  . Inguinal hernia repair    . Transurethral resection of prostate      Family History  Problem Relation Age of Onset  . Cancer Mother   . Cancer Father   . Cancer Other     sibling  . Heart disease Other     sibling    History  Substance Use  Topics  . Smoking status: Former Games developer  . Smokeless tobacco: Not on file     Comment: as a teenager  . Alcohol Use: No      Review of Systems  Constitutional: Negative for fever and chills.  Eyes: Positive for visual disturbance.  Respiratory: Negative for cough and shortness of breath.   Cardiovascular: Negative for chest pain, palpitations and leg swelling.  Gastrointestinal: Negative for nausea, vomiting, abdominal pain, diarrhea and blood in stool.  Musculoskeletal: Negative for back pain.  Neurological: Positive for dizziness. Negative for syncope, speech difficulty, light-headedness and headaches.  All other systems reviewed and are negative.    Allergies  Codeine; Sulfonamide derivatives; and Yeast-related products  Home Medications   Current Outpatient Rx  Name  Route  Sig  Dispense  Refill  . albuterol (PROAIR HFA) 108 (90 BASE) MCG/ACT inhaler   Inhalation   Inhale 2 puffs into the lungs every 6 (six) hours as needed for wheezing or shortness of breath.   3 Inhaler   3   . amLODipine (NORVASC) 5 MG tablet   Oral   Take 5 mg by mouth at bedtime.         Marland Kitchen aspirin 81 MG chewable tablet   Oral   Chew 81 mg by mouth every other day.          . cetirizine (ZYRTEC) 10 MG tablet   Oral   Take 10 mg by mouth every morning.         Marland Kitchen  clopidogrel (PLAVIX) 75 MG tablet   Oral   Take 1 tablet by mouth daily.         Marland Kitchen doxycycline (VIBRAMYCIN) 50 MG capsule   Oral   Take 50 mg by mouth every other day.         Marland Kitchen EPINEPHrine (EPIPEN 2-PAK) 0.3 mg/0.3 mL DEVI   Intramuscular   Inject 0.3 mLs (0.3 mg total) into the muscle once. Use once as needed for severe allergic reaction   1 Device   prn   . losartan-hydrochlorothiazide (HYZAAR) 100-12.5 MG per tablet   Oral   Take 1 tablet by mouth daily.         . mometasone-formoterol (DULERA) 100-5 MCG/ACT AERO   Inhalation   Inhale 2 puffs into the lungs 2 (two) times daily.   1 Inhaler   0   .  Multiple Vitamins-Minerals (SENIOR MULTIVITAMIN PLUS PO)   Oral   Take 1 tablet by mouth daily.           . simvastatin (ZOCOR) 40 MG tablet   Oral   Take 40 mg by mouth at bedtime.           . Tuberculin-Allergy Syringes (B-D ALLERGY SYRINGE 1CC/28G) 28G X 1/2" 1 ML MISC      Use as directed for allergy injections   100 each   0     BP 151/79  Pulse 58  Temp(Src) 97.6 F (36.4 C) (Oral)  Resp 18  SpO2 100%  Physical Exam  Nursing note and vitals reviewed. Constitutional: He is oriented to person, place, and time. He appears well-developed and well-nourished. No distress.  HENT:  Head: Normocephalic and atraumatic.  Mouth/Throat: No oropharyngeal exudate.  Eyes: EOM are normal. No scleral icterus.  Neck: Neck supple.  Cardiovascular: Normal rate, regular rhythm and intact distal pulses.   No murmur heard. Pulmonary/Chest: Effort normal. No respiratory distress. He has no wheezes.  Abdominal: Soft. Bowel sounds are normal. He exhibits no distension. There is no tenderness.  Musculoskeletal: He exhibits no edema and no tenderness.  Neurological: He is alert and oriented to person, place, and time. No cranial nerve deficit. He exhibits normal muscle tone. Coordination normal.  Skin: Skin is warm. No rash noted. He is not diaphoretic.  Psychiatric: He has a normal mood and affect.    ED Course  Procedures (including critical care time)  Labs Reviewed  CBC WITH DIFFERENTIAL - Abnormal; Notable for the following:    Neutrophils Relative % 79 (*)    Neutro Abs 8.2 (*)    All other components within normal limits  BASIC METABOLIC PANEL - Abnormal; Notable for the following:    Sodium 134 (*)    GFR calc non Af Amer 54 (*)    GFR calc Af Amer 62 (*)    All other components within normal limits  POCT I-STAT TROPONIN I  POCT I-STAT TROPONIN I   No results found.   1. Weakness     Room air saturation is 100% and I interpret this to be normal.  EKG at time  12:14, shows sinus rhythm at a rate of 52, first-degree AV block, normal axis, incomplete right bundle branch block. No priors are available for comparison. No acute ischemic changes are seen.  4:16 PM Pt has had no further episodes of symptoms.  Serial troponins are neg.  Pt has ambulated.  Will d/c home, ecnourage close follow up with PCP regarding his current antihypertensive medications.   MDM  Patient seems back to baseline, is not orthostatic by vital signs. Patient was started on 2 new antihypertensive 6 weeks. Patient had some transient tightness across his shoulders and some associated diaphoresis, all of which have resolved. There are no ischemic changes on his EKG, does not sound like an ischemic equivalent. Initial is negative. My plan is to get a second troponin after a few hours he continued patient's here in emergency department. He is also given some IV fluids. At this point the patient does not feel like he needs to be admitted serve in the hospital. He certainly understands that it is prudent to followup with his primary care physician this week, return to the emergency department for any significant worsening symptoms        Gavin Pound. Keasha Malkiewicz, MD 02/11/13 1616

## 2013-02-11 NOTE — Discharge Instructions (Signed)
Near-Syncope  Near-syncope is sudden weakness, dizziness, or feeling like you might pass out (faint). This may occur when getting up after sitting or while standing for a long period of time. Near-syncope can be caused by a drop in blood pressure. This is a common reaction, but it may occur to a greater degree in people taking medicines to control their blood pressure. Fainting often occurs when the blood pressure or pulse is too low to provide enough blood flow to the brain to keep you conscious. Fainting and near-syncope are not usually due to serious medical problems. However, certain people should be more cautious in the event of near-syncope, including elderly patients, patients with diabetes, and patients with a history of heart conditions (especially irregular rhythms).   CAUSES    Drop in blood pressure.   Physical pain.   Dehydration.   Heat exhaustion.   Emotional distress.   Low blood sugar.   Internal bleeding.   Heart and circulatory problems.   Infections.  SYMPTOMS    Dizziness.   Feeling sick to your stomach (nauseous).   Nearly fainting.   Body numbness.   Turning pale.   Tunnel vision.   Weakness.  HOME CARE INSTRUCTIONS    Lie down right away if you start feeling like you might faint. Breathe deeply and steadily. Wait until all the symptoms have passed. Most of these episodes last only a few minutes. You may feel tired for several hours.   Drink enough fluids to keep your urine clear or pale yellow.   If you are taking blood pressure or heart medicine, get up slowly, taking several minutes to sit and then stand. This can reduce dizziness that is caused by a drop in blood pressure.  SEEK IMMEDIATE MEDICAL CARE IF:    You have a severe headache.   Unusual pain develops in the chest, abdomen, or back.   There is bleeding from the mouth or rectum, or you have black or tarry stool.   An irregular heartbeat or a very rapid pulse develops.   You have repeated fainting or  seizure-like jerking during an episode.   You faint when sitting or lying down.   You develop confusion.   You have difficulty walking.   Severe weakness develops.   Vision problems develop.  MAKE SURE YOU:    Understand these instructions.   Will watch your condition.   Will get help right away if you are not doing well or get worse.  Document Released: 09/05/2005 Document Revised: 11/28/2011 Document Reviewed: 10/22/2010  [employer] Patient Information 2014 [employer]

## 2013-02-11 NOTE — ED Notes (Signed)
Pt resting quietly at this time.  St's he is ready to go home.  Family at bedside.  Dr. Oletta Lamas made aware of pt's Troponin results.

## 2013-02-11 NOTE — ED Notes (Signed)
Patient states he was working today and started feeling dizzy and had to sit down, patient states no loc but was "fuzzy" for a few minutes, patient states recent changes in blood pressure meds may have resulted, patient with a couple of these episodes in past week, patient a/o x4 at this time

## 2013-02-26 ENCOUNTER — Telehealth: Payer: Self-pay | Admitting: Internal Medicine

## 2013-02-26 NOTE — Telephone Encounter (Signed)
Spoke with patient-aware that we do not have any samples to give patient; states he is in the do-nut hole. I offered patient information on pt assistance to help with this. Pt declined at this time. Nothing more needed; will sign off.

## 2013-04-04 ENCOUNTER — Telehealth: Payer: Self-pay | Admitting: Internal Medicine

## 2013-04-04 MED ORDER — MOMETASONE FURO-FORMOTEROL FUM 100-5 MCG/ACT IN AERO: 2.0000 | INHALATION_SPRAY | Freq: Two times a day (BID) | RESPIRATORY_TRACT | Status: DC

## 2013-04-04 NOTE — Telephone Encounter (Signed)
Spoke with the pt and he is on a fixed income and is requesting a sample of dulera and proair to get him to next check. Samples of dulera left at front, no proair available. Carron Curie, CMA

## 2013-05-24 ENCOUNTER — Telehealth: Payer: Self-pay | Admitting: Internal Medicine

## 2013-05-24 ENCOUNTER — Ambulatory Visit (INDEPENDENT_AMBULATORY_CARE_PROVIDER_SITE_OTHER): Payer: Medicare Other

## 2013-05-24 DIAGNOSIS — J309 Allergic rhinitis, unspecified: Secondary | ICD-10-CM

## 2013-05-24 MED ORDER — ALBUTEROL SULFATE HFA 108 (90 BASE) MCG/ACT IN AERS: 2.0000 | INHALATION_SPRAY | Freq: Four times a day (QID) | RESPIRATORY_TRACT | Status: DC | PRN

## 2013-05-24 MED ORDER — MOMETASONE FURO-FORMOTEROL FUM 100-5 MCG/ACT IN AERO: 2.0000 | INHALATION_SPRAY | Freq: Two times a day (BID) | RESPIRATORY_TRACT | Status: DC

## 2013-05-24 NOTE — Telephone Encounter (Signed)
Pt is on dulera. Please advise if okay to give pt samples Dr. Maple Hudson thanks Last OV 08/09/12 Pending 08/09/13

## 2013-05-24 NOTE — Telephone Encounter (Signed)
Samples left and patient assistance given as well. Pt is aware. Carron Curie, CMA

## 2013-05-24 NOTE — Telephone Encounter (Signed)
Per CY-may give a sample of Dulera 100 2 puffs BID and Rinse mouth; also please give patient assistance program to patient.

## 2013-08-09 ENCOUNTER — Ambulatory Visit (INDEPENDENT_AMBULATORY_CARE_PROVIDER_SITE_OTHER): Payer: Medicare Other | Admitting: Internal Medicine

## 2013-08-09 ENCOUNTER — Encounter: Payer: Self-pay | Admitting: Internal Medicine

## 2013-08-09 VITALS — BP 122/76 | HR 67 | Ht 73.0 in | Wt 201.4 lb

## 2013-08-09 DIAGNOSIS — J309 Allergic rhinitis, unspecified: Secondary | ICD-10-CM

## 2013-08-09 DIAGNOSIS — J45909 Unspecified asthma, uncomplicated: Secondary | ICD-10-CM

## 2013-08-09 DIAGNOSIS — J45998 Other asthma: Secondary | ICD-10-CM

## 2013-08-09 MED ORDER — ALBUTEROL SULFATE HFA 108 (90 BASE) MCG/ACT IN AERS: 2.0000 | INHALATION_SPRAY | RESPIRATORY_TRACT | Status: DC | PRN

## 2013-08-09 MED ORDER — FLUTICASONE-SALMETEROL 250-50 MCG/DOSE IN AEPB: INHALATION_SPRAY | RESPIRATORY_TRACT | Status: DC

## 2013-08-09 MED ORDER — EPINEPHRINE 0.3 MG/0.3ML IJ SOAJ: INTRAMUSCULAR | Status: AC

## 2013-08-09 NOTE — Patient Instructions (Addendum)
Refill scripts for meds  We can continue Allergy vaccine 1:10 GO  You can ask Dr Cyndia Bent about the new pneumonia conjugate vaccine Prevnar 13

## 2013-08-09 NOTE — Progress Notes (Signed)
08/04/11- 79 year old male former smoker followed for allergic rhinitis, asthma, complicated by history of CVA, hyperlipidemia, HBP LOV-07/30/2010 He has had flu vaccine. Felt well through much of the past year until 2 days ago when he had onset of nasal congestion and chest congestion he considers "seasonal". He has continued allergies vaccine without problems and believes it helps him. We discussed the cost effectiveness of Advair and his rescue inhaler. He uses Claritin when needed.  08/09/12- 79 year old male former smoker followed for allergic rhinitis, asthma, complicated by history of CVA, hyperlipidemia, HBP Pt states breathing is doing well, denies any new co's today. He continues allergy vaccine 1:10 GO with no problems. He feels it helps. This has been a good year with little asthma. Occasional minor wheeze. He continues Advair 250 twice daily with occasional use of rescue inhaler.  08/09/13- 79 year old male former smoker followed for allergic rhinitis, asthma, complicated by history of CVA, hyperlipidemia, HBP FOLLOWS FOR: has done well through the season change. He continues allergy vaccine 1:10 GO with no problems. He feels it helps. Wearing a mask for yard work. Uses rescue inhaler about twice a day and uses either Advair or Dulera, which ever he can get samples of.  ROS-see HPI Constitutional:   No-   weight loss, night sweats, fevers, chills, fatigue, lassitude. HEENT:   No-  headaches, difficulty swallowing, tooth/dental problems, sore throat,       No-  sneezing, itching, ear ache, + recent nasal congestion, post nasal drip,  CV:  No-   chest pain, orthopnea, PND, swelling in lower extremities, anasarca,  dizziness, palpitations Resp: No- acute shortness of breath with exertion or at rest.              No-   productive cough,  No non-productive cough,  No- coughing up of blood.              No-   change in color of mucus.  No- wheezing.   Skin: No-   rash or lesions. GI:   No-   heartburn, indigestion, abdominal pain, nausea, vomiting,  GU: MS:  No-   joint pain or swelling.   Neuro-     nothing unusual Psych:  No- change in mood or affect. No depression or anxiety.  No memory loss.  OBJ General- Alert, Oriented, Affect-appropriate, Distress- none acute. Very sharp. Skin- rash-none, lesions- none, excoriation- none Lymphadenopathy- none Head- atraumatic            Eyes- Gross vision intact, PERRLA, conjunctivae clear secretions            Ears- Hearing, canals-normal            Nose- Clear, no-Septal dev, mucus, polyps, erosion, perforation             Throat- Mallampati II , mucosa red , drainage- none, tonsils- atrophic Neck- flexible , trachea midline, no stridor , thyroid nl, carotid no bruit Chest - symmetrical excursion , unlabored           Heart/CV- RRR , no murmur , no gallop  , no rub, nl s1 s2                           - JVD- none , edema- none, stasis changes- none, varices- none           Lung- clear to P&A, wheeze- none, cough- none , dullness-none, rub- none  Chest wall-  Abd-  Br/ Gen/ Rectal- Not done, not indicated Extrem- cyanosis- none, clubbing, none, atrophy- none, strength- nl Neuro- grossly intact to observation

## 2013-08-26 NOTE — Assessment & Plan Note (Signed)
Doing well with allergy vaccine. We discussed availability of Advair/Dulera and his insurance coverage for maintenance inhaler. Plan-refill rescue inhaler.

## 2013-08-26 NOTE — Assessment & Plan Note (Signed)
Plan-reviewed goals and expectations. We can continue allergy vaccine 1:10 GO

## 2013-09-05 ENCOUNTER — Ambulatory Visit (INDEPENDENT_AMBULATORY_CARE_PROVIDER_SITE_OTHER): Payer: Medicare Other

## 2013-09-05 DIAGNOSIS — J309 Allergic rhinitis, unspecified: Secondary | ICD-10-CM

## 2013-09-16 ENCOUNTER — Telehealth: Payer: Self-pay | Admitting: Internal Medicine

## 2013-09-16 MED ORDER — AZITHROMYCIN 250 MG PO TABS: ORAL_TABLET | ORAL | Status: DC

## 2013-09-16 NOTE — Telephone Encounter (Signed)
Called, spoke with pt.  C/o sore throat, HA, cough with creamy mucus, and increased SOB.  No wheezing, body aches, or fever but has noticed a little chills.  Symptoms started late Friday.  Has used abluterol hfa with some relief.  Is requesting zpak.  Dr. Maple Hudson, pls advise.  Thank you.  Last OV with CY: 08/09/13 Pending OV with CY: 08/11/14  [facility] [address]  Allergies verified with pt: Allergies  Allergen Reactions  . Codeine Anaphylaxis  . Sulfonamide Derivatives Anaphylaxis  . Yeast-Related Products Rash

## 2013-09-16 NOTE — Telephone Encounter (Signed)
Ok Zpak

## 2013-09-16 NOTE — Telephone Encounter (Signed)
Called, spoke with pt.  He is aware zpak sent to [facility].  He is to call back if symptoms do not improve or worsen.  He verbalized understanding, is in agreement with this plan, and voiced no further questions or concerns at this time.

## 2013-10-28 ENCOUNTER — Telehealth: Payer: Self-pay | Admitting: Internal Medicine

## 2013-10-28 NOTE — Telephone Encounter (Signed)
Samples of Advair and Dulera are up front for pick up. Pt is aware.

## 2014-01-03 ENCOUNTER — Telehealth: Payer: Self-pay | Admitting: Internal Medicine

## 2014-01-03 MED ORDER — FLUTICASONE-SALMETEROL 250-50 MCG/DOSE IN AEPB: INHALATION_SPRAY | RESPIRATORY_TRACT | Status: DC

## 2014-01-03 MED ORDER — MOMETASONE FURO-FORMOTEROL FUM 100-5 MCG/ACT IN AERO: 2.0000 | INHALATION_SPRAY | Freq: Two times a day (BID) | RESPIRATORY_TRACT | Status: DC

## 2014-01-03 NOTE — Telephone Encounter (Signed)
Spoke with pt.  He states that Dr Maple HudsonYoung [facility] whichever one he can get samples of.  Sample of Dulera and Advair left at front desk.  No samples of Proair at this time.

## 2014-01-03 NOTE — Telephone Encounter (Signed)
ATC pt. Went to [facility]. It has not been set up yet wcb

## 2014-02-26 ENCOUNTER — Telehealth: Payer: Self-pay | Admitting: Internal Medicine

## 2014-02-26 NOTE — Telephone Encounter (Signed)
Called made pt aware no samples available at this time. He did not need RX sent. Nothing further needed

## 2014-03-12 ENCOUNTER — Ambulatory Visit (INDEPENDENT_AMBULATORY_CARE_PROVIDER_SITE_OTHER): Payer: Medicare Other

## 2014-03-12 DIAGNOSIS — J309 Allergic rhinitis, unspecified: Secondary | ICD-10-CM

## 2014-04-21 ENCOUNTER — Telehealth: Payer: Self-pay | Admitting: Internal Medicine

## 2014-04-21 NOTE — Telephone Encounter (Signed)
Called spoke with pt. Made him aware we have no samples at this time. He did not need RX sent. Nothing further needed

## 2014-05-16 ENCOUNTER — Telehealth: Payer: Self-pay | Admitting: Internal Medicine

## 2014-05-16 NOTE — Telephone Encounter (Signed)
Called and spoke with pt and he stated that he is needing samples of dulera, advair and proair. Pt stated that he does not use the dulera and the advair together, he just uses what he has available.  Pt stated that CY told him to call if he needed these samples.  Pt stated that he is trying to stay out of the donut hole.  CY please advise. Thanks  Last ov--08/09/13 Next ov--08/11/14  Allergies  Allergen Reactions  . Codeine Anaphylaxis  . Sulfonamide Derivatives Anaphylaxis  . Yeast-Related Products Rash    Current Outpatient Prescriptions on File Prior to Visit  Medication Sig Dispense Refill  . albuterol (PROAIR HFA) 108 (90 BASE) MCG/ACT inhaler Inhale 2 puffs into the lungs every 4 (four) hours as needed for wheezing or shortness of breath.  1 Inhaler  prn  . amLODipine (NORVASC) 5 MG tablet Take 5 mg by mouth at bedtime.      Marland Kitchen aspirin 81 MG chewable tablet Chew 81 mg by mouth every other day.       Marland Kitchen azithromycin (ZITHROMAX) 250 MG tablet Take as directed  6 each  0  . cetirizine (ZYRTEC) 10 MG tablet Take 10 mg by mouth every morning.      . clopidogrel (PLAVIX) 75 MG tablet Take 1 tablet by mouth daily.      Marland Kitchen doxycycline (VIBRAMYCIN) 50 MG capsule Take 50 mg by mouth every other day.      Marland Kitchen EPINEPHrine (EPI-PEN) 0.3 mg/0.3 mL SOAJ injection Inject in thigh for severe allergic reaction  1 Device  prn  . Fluticasone-Salmeterol (ADVAIR DISKUS) 250-50 MCG/DOSE AEPB 1 puff then rinse mouth, twice daily  60 each  0  . Fluticasone-Salmeterol (ADVAIR) 250-50 MCG/DOSE AEPB Inhale 1 puff into the lungs 2 (two) times daily.      Marland Kitchen losartan-hydrochlorothiazide (HYZAAR) 100-12.5 MG per tablet Take 1 tablet by mouth daily.      . mometasone-formoterol (DULERA) 100-5 MCG/ACT AERO Inhale 2 puffs into the lungs 2 (two) times daily.  1 Inhaler  0  . Multiple Vitamins-Minerals (SENIOR MULTIVITAMIN PLUS PO) Take 1 tablet by mouth daily.        . simvastatin (ZOCOR) 40 MG tablet Take 40 mg by mouth at  bedtime.        . Tuberculin-Allergy Syringes (B-D ALLERGY SYRINGE 1CC/28G) 28G X 1/2" 1 ML MISC Use as directed for allergy injections  100 each  0   No current facility-administered medications on file prior to visit.

## 2014-05-16 NOTE — Telephone Encounter (Signed)
I never tell anybody to "call if they need samples". We don't get enough to maintain people anymore.  We should help him get compressor/nebulizer and albuterol 0.083% neb solution, 150 ml, 1 neb 2-3 times daily as needed. Ref prn. Through a DME company this should be much cheaper for him. Try this instead of Advair, Dulera, Proair.

## 2014-05-16 NOTE — Telephone Encounter (Signed)
Called and spoke with pt and he is aware of CY recs.  He will call once he starts to get low on his medications to get the nebulizer set up.  Nothing further is needed at this time.

## 2014-07-03 ENCOUNTER — Telehealth: Payer: Self-pay | Admitting: Internal Medicine

## 2014-07-03 NOTE — Telephone Encounter (Signed)
Called spoke with pt. Pt is aware we do not have any samples at this time.

## 2014-07-10 ENCOUNTER — Ambulatory Visit (INDEPENDENT_AMBULATORY_CARE_PROVIDER_SITE_OTHER): Payer: Medicare Other

## 2014-07-10 DIAGNOSIS — J309 Allergic rhinitis, unspecified: Secondary | ICD-10-CM

## 2014-07-23 ENCOUNTER — Encounter: Payer: Self-pay | Admitting: Internal Medicine

## 2014-08-11 ENCOUNTER — Ambulatory Visit (INDEPENDENT_AMBULATORY_CARE_PROVIDER_SITE_OTHER): Payer: Medicare Other | Admitting: Internal Medicine

## 2014-08-11 ENCOUNTER — Encounter: Payer: Self-pay | Admitting: Internal Medicine

## 2014-08-11 VITALS — BP 118/66 | HR 67 | Ht 73.0 in | Wt 203.2 lb

## 2014-08-11 DIAGNOSIS — J309 Allergic rhinitis, unspecified: Secondary | ICD-10-CM

## 2014-08-11 DIAGNOSIS — J45998 Other asthma: Secondary | ICD-10-CM

## 2014-08-11 DIAGNOSIS — J302 Other seasonal allergic rhinitis: Secondary | ICD-10-CM

## 2014-08-11 DIAGNOSIS — J3089 Other allergic rhinitis: Secondary | ICD-10-CM

## 2014-08-11 MED ORDER — MOMETASONE FURO-FORMOTEROL FUM 100-5 MCG/ACT IN AERO: 2.0000 | INHALATION_SPRAY | Freq: Two times a day (BID) | RESPIRATORY_TRACT | Status: DC

## 2014-08-11 MED ORDER — ALBUTEROL SULFATE 108 (90 BASE) MCG/ACT IN AEPB: 2.0000 | INHALATION_SPRAY | Freq: Four times a day (QID) | RESPIRATORY_TRACT | Status: DC

## 2014-08-11 NOTE — Patient Instructions (Addendum)
Script and free sample card for Proair Respiclick   2 puffs every 4-6 hours if needed- rescue. You can use this or the regular Proair HFA inhaler- don't need both.  Sample  Dulera 100 if available    2 puffs and rinse mouth, twice daily  We can continue allergy vaccine 1:10 GO every 1-2 weeks  Please call as needed

## 2014-08-11 NOTE — Progress Notes (Signed)
08/04/11- 80 year old male former smoker followed for allergic rhinitis, asthma, complicated by history of CVA, hyperlipidemia, HBP LOV-07/30/2010 He has had flu vaccine. Felt well through much of the past year until 2 days ago when he had onset of nasal congestion and chest congestion he considers "seasonal". He has continued allergies vaccine without problems and believes it helps him. We discussed the cost effectiveness of Advair and his rescue inhaler. He uses Claritin when needed.  08/09/12- 80 year old male former smoker followed for allergic rhinitis, asthma, complicated by history of CVA, hyperlipidemia, HBP Pt states breathing is doing well, denies any new co's today. He continues allergy vaccine 1:10 GO with no problems. He feels it helps. This has been a good year with little asthma. Occasional minor wheeze. He continues Advair 250 twice daily with occasional use of rescue inhaler.  08/09/13- 80 year old male former smoker followed for allergic rhinitis, asthma, complicated by history of CVA, hyperlipidemia, HBP FOLLOWS FOR: has done well through the season change. He continues allergy vaccine 1:10 GO with no problems. He feels it helps. Wearing a mask for yard work. Uses rescue inhaler about twice a day and uses either Advair or Dulera, which ever he can get samples of.  08/11/14- 80 year old male former smoker followed for allergic rhinitis, asthma, complicated by history of CVA, hyperlipidemia, HBP FOLLOWS FOR: Still on Allergy  Vaccine 1:10 GO at home and doing well; needs refill for allergy needles.  He feels allergy shots are helpful to him. We talked about trying every other week, especially outside of pollen seasons, as a way of considering cutting back and eventually stopping. He uses either Dulera or Advair depending on what he can get for samples. Much less asthma activity with only one episode this year despite traveling  ROS-see HPI Constitutional:   No-   weight loss,  night sweats, fevers, chills, fatigue, lassitude. HEENT:   No-  headaches, difficulty swallowing, tooth/dental problems, sore throat,       No-  sneezing, itching, ear ache, + recent nasal congestion, post nasal drip,  CV:  No-   chest pain, orthopnea, PND, swelling in lower extremities, anasarca,  dizziness, palpitations Resp: No- acute shortness of breath with exertion or at rest.              No-   productive cough,  No non-productive cough,  No- coughing up of blood.              No-   change in color of mucus.  No- wheezing.   Skin: No-   rash or lesions. GI:  No-   heartburn, indigestion, abdominal pain, nausea, vomiting,  GU: MS:  No-   joint pain or swelling.   Neuro-     nothing unusual Psych:  No- change in mood or affect. No depression or anxiety.  No memory loss.  OBJ General- Alert, Oriented, Affect-appropriate, Distress- none acute. Very sharp. Skin- rash-none, lesions- none, excoriation- none Lymphadenopathy- none Head- atraumatic            Eyes- Gross vision intact, PERRLA, conjunctivae clear secretions            Ears- Hearing, canals-normal            Nose- Clear, no-Septal dev, mucus, polyps, erosion, perforation             Throat- Mallampati II , mucosa red , drainage- none, tonsils- atrophic Neck- flexible , trachea midline, no stridor , thyroid nl, carotid no bruit Chest - symmetrical  excursion , unlabored           Heart/CV- RRR , no murmur , no gallop  , no rub, nl s1 s2                           - JVD- none , edema- none, stasis changes- none, varices- none           Lung- clear to P&A, wheeze- none, cough- none , dullness-none, rub- none           Chest wall-  Abd-  Br/ Gen/ Rectal- Not done, not indicated Extrem- cyanosis- none, clubbing, none, atrophy- none, strength- nl Neuro- grossly intact to observation

## 2014-08-15 NOTE — Assessment & Plan Note (Signed)
Okay to continue allergy vaccine as he believes it helps him Plan-suggested he start getting his shots every other week Refill prescription for allergy syringes

## 2014-08-15 NOTE — Assessment & Plan Note (Signed)
Mild intermittent asthma, well controlled. I am a little concerned that he depends so much on samples, which we discussed. Plan-discount card pro-air Respiclick rescue inhaler

## 2014-08-27 ENCOUNTER — Telehealth: Payer: Self-pay | Admitting: Internal Medicine

## 2014-08-27 MED ORDER — AZITHROMYCIN 250 MG PO TABS: ORAL_TABLET | ORAL | Status: DC

## 2014-08-27 NOTE — Telephone Encounter (Signed)
Patient is returning call.  [phone]

## 2014-08-27 NOTE — Telephone Encounter (Signed)
lmtcb for pt.  

## 2014-08-27 NOTE — Telephone Encounter (Signed)
Spoke with the pt and notified of recs per KC  He verbalized understanding  Rx was sent to pharm   

## 2014-08-27 NOTE — Telephone Encounter (Signed)
Pt c/o sore throat, sinus congestion, PND, prod cough with yellow mucus X1 week.  Denies fever, sob, chills.  Requesting zpak sent to [facility] [address].  Since CY is not in the office will route to Doc of Day.  Dr. Shelle Ironlance please advise.  Thanks!  Allergies  Allergen Reactions  . Codeine Anaphylaxis  . Sulfonamide Derivatives Anaphylaxis  . Yeast-Related Products Rash   Current Outpatient Prescriptions on File Prior to Visit  Medication Sig Dispense Refill  . Albuterol Sulfate (PROAIR RESPICLICK) 108 (90 BASE) MCG/ACT AEPB Inhale 2 puffs into the lungs 4 (four) times daily. Every 4-6 hours as directed 1 each prn  . amLODipine (NORVASC) 5 MG tablet Take 5 mg by mouth at bedtime.    Marland Kitchen. aspirin 81 MG chewable tablet Chew 81 mg by mouth every other day.     . cetirizine (ZYRTEC) 10 MG tablet Take 10 mg by mouth every morning.    . clopidogrel (PLAVIX) 75 MG tablet Take 1 tablet by mouth daily.    Marland Kitchen. doxycycline (VIBRAMYCIN) 50 MG capsule Take 50 mg by mouth every other day.    Marland Kitchen. EPINEPHrine (EPI-PEN) 0.3 mg/0.3 mL SOAJ injection Inject in thigh for severe allergic reaction 1 Device prn  . Fluticasone-Salmeterol (ADVAIR) 250-50 MCG/DOSE AEPB Inhale 1 puff into the lungs 2 (two) times daily.    Marland Kitchen. losartan-hydrochlorothiazide (HYZAAR) 100-12.5 MG per tablet Take 1 tablet by mouth daily.    . mometasone-formoterol (DULERA) 100-5 MCG/ACT AERO Inhale 2 puffs into the lungs 2 (two) times daily. 1 Inhaler 0  . mometasone-formoterol (DULERA) 100-5 MCG/ACT AERO Inhale 2 puffs into the lungs 2 (two) times daily. 1 Inhaler 0  . Multiple Vitamins-Minerals (SENIOR MULTIVITAMIN PLUS PO) Take 1 tablet by mouth daily.      . simvastatin (ZOCOR) 40 MG tablet Take 40 mg by mouth at bedtime.      . Tuberculin-Allergy Syringes (B-D ALLERGY SYRINGE 1CC/28G) 28G X 1/2" 1 ML MISC Use as directed for allergy injections 100 each 0   No current facility-administered medications on file prior to visit.

## 2014-08-27 NOTE — Telephone Encounter (Signed)
Ok with me 

## 2014-09-30 ENCOUNTER — Telehealth: Payer: Self-pay | Admitting: Internal Medicine

## 2014-09-30 MED ORDER — MOMETASONE FURO-FORMOTEROL FUM 100-5 MCG/ACT IN AERO: 2.0000 | INHALATION_SPRAY | Freq: Two times a day (BID) | RESPIRATORY_TRACT | Status: DC

## 2014-09-30 NOTE — Telephone Encounter (Signed)
Samples of Dulera have been placed up front for pick up. Pt is aware. Nothing further was needed.

## 2014-11-21 ENCOUNTER — Ambulatory Visit (INDEPENDENT_AMBULATORY_CARE_PROVIDER_SITE_OTHER): Payer: Medicare Other

## 2014-11-21 DIAGNOSIS — J309 Allergic rhinitis, unspecified: Secondary | ICD-10-CM

## 2014-12-08 ENCOUNTER — Telehealth: Payer: Self-pay | Admitting: Internal Medicine

## 2014-12-08 DIAGNOSIS — J45998 Other asthma: Secondary | ICD-10-CM

## 2014-12-08 MED ORDER — ALBUTEROL SULFATE (2.5 MG/3ML) 0.083% IN NEBU: 2.5000 mg | INHALATION_SOLUTION | Freq: Three times a day (TID) | RESPIRATORY_TRACT | Status: DC | PRN

## 2014-12-08 NOTE — Telephone Encounter (Signed)
Manuel Budgelinton D Young, MD at 05/16/2014 3:45 PM     Status: Signed       Expand All Collapse All   I never tell anybody to "call if they need samples". We don't get enough to maintain people anymore.  We should help him get compressor/nebulizer and albuterol 0.083% neb solution, 150 ml, 1 neb 2-3 times daily as needed. Ref prn. Through a DME company this should be much cheaper for him. Try this instead of Advair, Dulera, Proair.           Called and spoke to pt. Pt requesting Dulera samples. One sample left up front for pick up to last till neb comes in. Pt verbalized understanding and denied any further questions or concerns at this time.

## 2014-12-09 ENCOUNTER — Telehealth: Payer: Self-pay | Admitting: Internal Medicine

## 2014-12-09 MED ORDER — ALBUTEROL SULFATE (2.5 MG/3ML) 0.083% IN NEBU: 2.5000 mg | INHALATION_SOLUTION | Freq: Three times a day (TID) | RESPIRATORY_TRACT | Status: DC

## 2014-12-09 NOTE — Telephone Encounter (Signed)
Manuel AddisonKatie has faxed rx. Nothing further was needed.

## 2014-12-09 NOTE — Telephone Encounter (Signed)
Spoke with Selena BattenKim at [facility]. New rx will need to be faxed to them without PRN in the sig. This has been printed and placed on CY's cart. [facility]

## 2015-03-17 ENCOUNTER — Telehealth: Payer: Self-pay | Admitting: Internal Medicine

## 2015-03-17 NOTE — Telephone Encounter (Signed)
Date Mixed: 03/17/2015 Vial: A Strength: 1:10 Here/Mail/Pick Up: Mail Mixed By: Jaynee EaglesLindsay Arpita Fentress, CMA

## 2015-03-18 ENCOUNTER — Ambulatory Visit (INDEPENDENT_AMBULATORY_CARE_PROVIDER_SITE_OTHER): Payer: Medicare Other

## 2015-03-18 DIAGNOSIS — J309 Allergic rhinitis, unspecified: Secondary | ICD-10-CM | POA: Diagnosis not present

## 2015-05-07 ENCOUNTER — Encounter: Payer: Self-pay | Admitting: Internal Medicine

## 2015-06-16 ENCOUNTER — Telehealth: Payer: Self-pay | Admitting: Internal Medicine

## 2015-06-16 ENCOUNTER — Ambulatory Visit (INDEPENDENT_AMBULATORY_CARE_PROVIDER_SITE_OTHER): Payer: Medicare Other

## 2015-06-16 DIAGNOSIS — J309 Allergic rhinitis, unspecified: Secondary | ICD-10-CM | POA: Diagnosis not present

## 2015-06-16 NOTE — Telephone Encounter (Signed)
Allergy Serum Extract Date Mixed: 06/16/15 Vial: 1 Strength: 1:10 Here/Mail/Pick Up: mail Mixed By: tbs 

## 2015-06-18 ENCOUNTER — Telehealth: Payer: Self-pay | Admitting: Internal Medicine

## 2015-06-18 NOTE — Telephone Encounter (Signed)
Per 06/16/15 phone note: Vassie Loll at 06/16/2015 10:33 AM     Status: Signed       Expand All Collapse All   Allergy Serum Extract Date Mixed: 06/16/15 Vial: 1 Strength: 1:10 Here/Mail/Pick Up: mail Mixed By: tbs      ---  LMTCB x1 for pt

## 2015-06-19 NOTE — Telephone Encounter (Signed)
Called made pt aware of below. Nothing further needed 

## 2015-08-12 ENCOUNTER — Ambulatory Visit: Payer: Medicare Other | Admitting: Internal Medicine

## 2015-08-25 ENCOUNTER — Telehealth: Payer: Self-pay | Admitting: Internal Medicine

## 2015-08-25 NOTE — Telephone Encounter (Signed)
Pt. Needs an appt.Marland Kitchen. He did not show for his 08/12/15 appt..,His last appt. Was 08/11/14. Katie please see if you can get him in by the first of the yr. So he can cont. His allergy vac.. Not urgent just add it to your to do list. Don't shot the messenger. Thanks!  Crystale Giannattasio

## 2015-08-26 NOTE — Telephone Encounter (Signed)
Noted. Nothing further needed. Thanks again!

## 2015-08-26 NOTE — Telephone Encounter (Signed)
Patient scheduled to see Dr. Maple HudsonYoung tomorrow (12/8) at 2:15pm.   Patient aware of appointment. FYI to [employer]my Scott.

## 2015-08-27 ENCOUNTER — Ambulatory Visit (INDEPENDENT_AMBULATORY_CARE_PROVIDER_SITE_OTHER): Payer: Medicare Other | Admitting: Internal Medicine

## 2015-08-27 ENCOUNTER — Encounter: Payer: Self-pay | Admitting: Internal Medicine

## 2015-08-27 VITALS — BP 118/64 | HR 63 | Ht 73.0 in | Wt 196.0 lb

## 2015-08-27 DIAGNOSIS — J45998 Other asthma: Secondary | ICD-10-CM | POA: Diagnosis not present

## 2015-08-27 DIAGNOSIS — J309 Allergic rhinitis, unspecified: Secondary | ICD-10-CM

## 2015-08-27 DIAGNOSIS — J302 Other seasonal allergic rhinitis: Secondary | ICD-10-CM

## 2015-08-27 DIAGNOSIS — J3089 Other allergic rhinitis: Principal | ICD-10-CM

## 2015-08-27 MED ORDER — AZITHROMYCIN 250 MG PO TABS: ORAL_TABLET | ORAL | Status: DC

## 2015-08-27 MED ORDER — ALBUTEROL SULFATE 108 (90 BASE) MCG/ACT IN AEPB: 2.0000 | INHALATION_SPRAY | Freq: Four times a day (QID) | RESPIRATORY_TRACT | Status: DC

## 2015-08-27 MED ORDER — MOMETASONE FURO-FORMOTEROL FUM 200-5 MCG/ACT IN AERO: INHALATION_SPRAY | RESPIRATORY_TRACT | Status: DC

## 2015-08-27 NOTE — Patient Instructions (Addendum)
We can continue allergy shots another year  Sample and script [facility]   2 puffs then rinse mouth, twice daily  Script printed for Z pak  Script printed for [facility] ProductsProair rescue inhaler

## 2015-08-27 NOTE — Progress Notes (Signed)
08/04/11- 81 year old male former smoker followed for allergic rhinitis, asthma, complicated by history of CVA, hyperlipidemia, HBP LOV-07/30/2010 He has had flu vaccine. Felt well through much of the past year until 2 days ago when he had onset of nasal congestion and chest congestion he considers "seasonal". He has continued allergies vaccine without problems and believes it helps him. We discussed the cost effectiveness of Advair and his rescue inhaler. He uses Claritin when needed.  08/09/12- 81 year old male former smoker followed for allergic rhinitis, asthma, complicated by history of CVA, hyperlipidemia, HBP Pt states breathing is doing well, denies any new co's today. He continues allergy vaccine 1:10 GO with no problems. He feels it helps. This has been a good year with little asthma. Occasional minor wheeze. He continues Advair 250 twice daily with occasional use of rescue inhaler.  08/09/13- 81 year old male former smoker followed for allergic rhinitis, asthma, complicated by history of CVA, hyperlipidemia, HBP FOLLOWS FOR: has done well through the season change. He continues allergy vaccine 1:10 GO with no problems. He feels it helps. Wearing a mask for yard work. Uses rescue inhaler about twice a day and uses either Advair or Dulera, which ever he can get samples of.  08/11/14- 81 year old male former smoker followed for allergic rhinitis, asthma, complicated by history of CVA, hyperlipidemia, HBP FOLLOWS FOR: Still on Allergy  Vaccine 1:10 GO at home and doing well; needs refill for allergy needles.  He feels allergy shots are helpful to him. We talked about trying every other week, especially outside of pollen seasons, as a way of considering cutting back and eventually stopping. He uses either Dulera or Advair depending on what he can get for samples. Much less asthma activity with only one episode this year despite traveling  08/27/2015-81 year old male former smoker followed  for allergic rhinitis, asthma, complicated by history CVA, hyperlipidemia, HBP Allergy Vaccine G0 1:10 FOLLOWS FOR: Pt continues his allergy vaccine weekly and no reactions. Pt would like to discuss getting dulera Rx and sample if possible. Also, would like to get Zpak Rx to have on hand in case.  Has nebulizer machine at home if needed  ROS-see HPI Constitutional:   No-   weight loss, night sweats, fevers, chills, fatigue, lassitude. HEENT:   No-  headaches, difficulty swallowing, tooth/dental problems, sore throat,       No-  sneezing, itching, ear ache, + recent nasal congestion, post nasal drip,  CV:  No-   chest pain, orthopnea, PND, swelling in lower extremities, anasarca,  dizziness, palpitations Resp: No- acute shortness of breath with exertion or at rest.              No-   productive cough,  No non-productive cough,  No- coughing up of blood.              No-   change in color of mucus.  No- wheezing.   Skin: No-   rash or lesions. GI:  No-   heartburn, indigestion, abdominal pain, nausea, vomiting,  GU: MS:  No-   joint pain or swelling.   Neuro-     nothing unusual Psych:  No- change in mood or affect. No depression or anxiety.  No memory loss.  OBJ General- Alert, Oriented, Affect-appropriate, Distress- none acute. Very sharp. Skin- rash-none, lesions- none, excoriation- none Lymphadenopathy- none Head- atraumatic            Eyes- Gross vision intact, PERRLA, conjunctivae clear secretions  Ears- Hearing, canals-normal            Nose- Clear, no-Septal dev, mucus, polyps, erosion, perforation             Throat- Mallampati II , mucosa red , drainage- none, tonsils- atrophic Neck- flexible , trachea midline, no stridor , thyroid nl, carotid no bruit Chest - symmetrical excursion , unlabored           Heart/CV- RRR , no murmur , no gallop  , no rub, nl s1 s2                           - JVD- none , edema- none, stasis changes- none, varices- none           Lung-  clear to P&A, wheeze- none, cough- none , dullness-none, rub- none           Chest wall-  Abd-  Br/ Gen/ Rectal- Not done, not indicated Extrem- cyanosis- none, clubbing, none, atrophy- none, strength- nl Neuro- grossly intact to observation

## 2015-08-28 NOTE — Assessment & Plan Note (Signed)
Mild intermittent well-controlled. [facility], and for Z-Pak to hold as discussed

## 2015-08-28 NOTE — Assessment & Plan Note (Signed)
We agreed to continue vaccine 1 more year, antihistamines as needed

## 2015-10-23 ENCOUNTER — Telehealth: Payer: Self-pay | Admitting: Internal Medicine

## 2015-10-23 DIAGNOSIS — J309 Allergic rhinitis, unspecified: Secondary | ICD-10-CM | POA: Diagnosis not present

## 2015-10-23 NOTE — Telephone Encounter (Signed)
Allergy Serum Extract Date Mixed: 10/23/15  Vial: 1 Strength: 1:10 Here/Mail/Pick Up: mail Mixed By: tbs Last OV: 08/27/15 Pending OV: 08/29/16

## 2016-01-25 ENCOUNTER — Telehealth: Payer: Self-pay | Admitting: Internal Medicine

## 2016-01-25 DIAGNOSIS — J309 Allergic rhinitis, unspecified: Secondary | ICD-10-CM | POA: Diagnosis not present

## 2016-01-25 NOTE — Telephone Encounter (Signed)
Allergy Serum Extract Date Mixed: 01/25/16 Vial: 1 Strength: 1:10 Here/Mail/Pick Up: mail Mixed By: tbs Last OV: 08/27/15 Pending OV: 08/29/16

## 2016-04-27 ENCOUNTER — Telehealth: Payer: Self-pay | Admitting: Internal Medicine

## 2016-04-27 DIAGNOSIS — J309 Allergic rhinitis, unspecified: Secondary | ICD-10-CM | POA: Diagnosis not present

## 2016-04-28 NOTE — Telephone Encounter (Addendum)
Allergy Serum Extract Date Mixed: 04/27/16 Vial: 1 Strength: 1:10 Here/Mail/Pick Up: mail Mixed By: tbs Last OV: 08/27/15 Pending OV: 08/29/16 Busy day, a lot going on, staff meeting. Katie aware.

## 2016-08-28 ENCOUNTER — Other Ambulatory Visit: Payer: Self-pay | Admitting: Internal Medicine

## 2016-08-29 ENCOUNTER — Ambulatory Visit (INDEPENDENT_AMBULATORY_CARE_PROVIDER_SITE_OTHER): Payer: Medicare Other | Admitting: Internal Medicine

## 2016-08-29 ENCOUNTER — Encounter: Payer: Self-pay | Admitting: Internal Medicine

## 2016-08-29 DIAGNOSIS — J3089 Other allergic rhinitis: Secondary | ICD-10-CM

## 2016-08-29 DIAGNOSIS — J4530 Mild persistent asthma, uncomplicated: Secondary | ICD-10-CM | POA: Diagnosis not present

## 2016-08-29 DIAGNOSIS — J302 Other seasonal allergic rhinitis: Secondary | ICD-10-CM

## 2016-08-29 MED ORDER — MOMETASONE FURO-FORMOTEROL FUM 200-5 MCG/ACT IN AERO: INHALATION_SPRAY | RESPIRATORY_TRACT | 12 refills | Status: DC

## 2016-08-29 NOTE — Patient Instructions (Addendum)
Ok to use up your allergy vaccine and quit shots from this office as of the end of December. As discussed, you may very well not need allergy shots any longer. If you do want to look into it more, you can establish with one of the other allergy offices in the area.   We will still be at this office for breathing issues if needed.  Sample Dulera 200  if available, script printed  Please call as needed

## 2016-08-29 NOTE — Progress Notes (Signed)
HPI male former smoker followed for allergic rhinitis, asthma, complicated by history CVA, hyperlipidemia, HBP  --------------------------------------  08/27/2015-82 year old male former smoker followed for allergic rhinitis, asthma, complicated by history CVA, hyperlipidemia, HBP Allergy Vaccine G0 1:10 FOLLOWS FOR: Pt continues his allergy vaccine weekly and no reactions. Pt would like to discuss getting dulera Rx and sample if possible. Also, would like to get Zpak Rx to have on hand in case.  Has nebulizer machine at home if needed  08/29/2016-82 year old male former smoker followed for Allergic rhinitis, Asthma, complicated by history CVA, hyperlipidemia, HBP Allergy Vaccine GO 1:10 follows for a [age]yr old follow up We discussed plans to close our allergy vaccine program here at the end of December and suggested he plan to stop allergy shots when he runs out of vaccine then wait to see how he does. Infrequent need for rescue inhaler and no associated sleep disturbance. He uses either Advair or Dulera, depending on what samples he has, especially when he goes out of town. While at home he depends more on his nebulizer machine for occasional use without maintenance controller. No significant exacerbations since last here.  ROS-see HPI Constitutional:   No-   weight loss, night sweats, fevers, chills, fatigue, lassitude. HEENT:   No-  headaches, difficulty swallowing, tooth/dental problems, sore throat,       No-  sneezing, itching, ear ache, + recent nasal congestion, post nasal drip,  CV:  No-   chest pain, orthopnea, PND, swelling in lower extremities, anasarca,  dizziness, palpitations Resp: No- acute shortness of breath with exertion or at rest.              No-   productive cough,  No non-productive cough,  No- coughing up of blood.              No-   change in color of mucus.  No- wheezing.   Skin: No-   rash or lesions. GI:  No-   heartburn, indigestion, abdominal pain, nausea,  vomiting,  GU: MS:  No-   joint pain or swelling.   Neuro-     nothing unusual Psych:  No- change in mood or affect. No depression or anxiety.  No memory loss.  OBJ General- Alert, Oriented, Affect-appropriate, Distress- none acute. Very sharp. Skin- rash-none, lesions- none, excoriation- none Lymphadenopathy- none Head- atraumatic            Eyes- Gross vision intact, PERRLA, conjunctivae clear secretions            Ears- Hearing, canals-normal            Nose- Clear, no-Septal dev, mucus, polyps, erosion, perforation             Throat- Mallampati II , mucosa red , drainage- none, tonsils- atrophic Neck- flexible , trachea midline, no stridor , thyroid nl, carotid no bruit Chest - symmetrical excursion , unlabored           Heart/CV- RRR , no murmur , no gallop  , no rub, nl s1 s2                           - JVD- none , edema- none, stasis changes- none, varices- none           Lung- clear to P&A, wheeze- none, cough- none , dullness-none, rub- none           Chest wall-  Abd-  Br/ Gen/ Rectal- Not done,  not indicated Extrem- cyanosis- none, clubbing, none, atrophy- none, strength- nl Neuro- grossly intact to observation

## 2016-08-30 NOTE — Assessment & Plan Note (Signed)
He looks fit and healthy. Discussed maintenance versus rescue inhaler use and his nebulizer machine. He asks for sample of either Advair or Dulera today and we have neither in the office. [facility]

## 2016-08-30 NOTE — Assessment & Plan Note (Signed)
He has been well-controlled. Has been on allergy vaccine long enough that he should stop now and watch to see if he still needs it rather than assuming he needs to start up again someplace else. Flonase and an antihistamine may be helpful.

## 2016-09-06 ENCOUNTER — Telehealth: Payer: Self-pay | Admitting: *Deleted

## 2016-09-06 NOTE — Telephone Encounter (Signed)
Created in error

## 2016-09-06 NOTE — Telephone Encounter (Signed)
I called pt. To let him know I received he vaccine order. Also CY said:"Ok to use up allergy vaccine and quit shots from this office as of the end of the December. Mr. Manuel [name] said CY said since he had sent his order form in he could use that up and then stop. That was not in your ov notes. This however was in your ov note: He has been taking allergy vaccine long enough that he should stop now and watch to see if he still needs it rather than assuming he needs to start up again someplace else. Pt. Understood, nothing further needed. Closing.

## 2017-06-22 ENCOUNTER — Telehealth: Payer: Self-pay | Admitting: Internal Medicine

## 2017-06-22 NOTE — Telephone Encounter (Signed)
ATC pt - No voicemail set up - Will call back

## 2017-06-22 NOTE — Telephone Encounter (Signed)
Called and spoke to pt. Pt is requesting a sample of Dulera or Advair. We do not have Dulera samples but do have Advair 250 samples. Pt was last seen in 08/2016 and advised to f/u in one year and was given a Dulera sample, pt has been scheduled to see CY in 09/2017.  Dr. Maple Hudson please advise if ok to give pt sample. Thanks.   Allergies  Allergen Reactions  . Codeine Anaphylaxis  . Sulfonamide Derivatives Anaphylaxis  . Yeast-Related Products Rash    Current Outpatient Prescriptions on File Prior to Visit  Medication Sig Dispense Refill  . albuterol (PROVENTIL) (2.5 MG/3ML) 0.083% nebulizer solution Take 3 mLs (2.5 mg total) by nebulization 3 (three) times daily. 270 mL 11  . Albuterol Sulfate (PROAIR RESPICLICK) 108 (90 BASE) MCG/ACT AEPB Inhale 2 puffs into the lungs 4 (four) times daily. Every 4-6 hours as directed 1 each prn  . amLODipine (NORVASC) 5 MG tablet Take 5 mg by mouth at bedtime.    . B-D INS SYR MICROFINE 1CC/27G 27G X 5/8" 1 ML MISC USE TO GIVE ALLERGY VACCINE OR OTHER MEDICATIONS AS DIRECTED. 100 each 0  . cetirizine (ZYRTEC) 10 MG tablet Take 10 mg by mouth every morning.    . clopidogrel (PLAVIX) 75 MG tablet Take 1 tablet by mouth daily.    Marland Kitchen EPINEPHrine (EPI-PEN) 0.3 mg/0.3 mL SOAJ injection Inject in thigh for severe allergic reaction 1 Device prn  . Fluticasone-Salmeterol (ADVAIR) 250-50 MCG/DOSE AEPB Inhale 1 puff into the lungs 2 (two) times daily.    Marland Kitchen losartan-hydrochlorothiazide (HYZAAR) 100-12.5 MG per tablet Take 1 tablet by mouth daily.    . mometasone-formoterol (DULERA) 200-5 MCG/ACT AERO 2 puffs then rinse mouth, twice daily 1 Inhaler 12  . Multiple Vitamins-Minerals (SENIOR MULTIVITAMIN PLUS PO) Take 1 tablet by mouth daily.      . NONFORMULARY OR COMPOUNDED ITEM Allergy Vaccine 1:10 Gets at [facility]    . simvastatin (ZOCOR) 40 MG tablet Take 40 mg by mouth at bedtime.      . Tuberculin-Allergy Syringes (B-D ALLERGY SYRINGE 1CC/28G) 28G X 1/2" 1  ML MISC Use as directed for allergy injections 100 each 0   No current facility-administered medications on file prior to visit.

## 2017-06-22 NOTE — Telephone Encounter (Signed)
Ok to give sample Advair 250,    Inhale 1 puff then rinse mouth twice daily

## 2017-06-23 NOTE — Telephone Encounter (Signed)
Spoke with pt, advised sample of Advair is up front for pick up. Nothing further is needed.

## 2017-10-19 ENCOUNTER — Ambulatory Visit: Payer: Medicare Other | Admitting: Internal Medicine

## 2017-10-19 ENCOUNTER — Encounter: Payer: Self-pay | Admitting: Internal Medicine

## 2017-10-19 DIAGNOSIS — J4530 Mild persistent asthma, uncomplicated: Secondary | ICD-10-CM | POA: Diagnosis not present

## 2017-10-19 DIAGNOSIS — J3089 Other allergic rhinitis: Secondary | ICD-10-CM | POA: Diagnosis not present

## 2017-10-19 DIAGNOSIS — J302 Other seasonal allergic rhinitis: Secondary | ICD-10-CM

## 2017-10-19 MED ORDER — MOMETASONE FURO-FORMOTEROL FUM 200-5 MCG/ACT IN AERO: INHALATION_SPRAY | RESPIRATORY_TRACT | 12 refills | Status: DC

## 2017-10-19 MED ORDER — AZITHROMYCIN 250 MG PO TABS: ORAL_TABLET | ORAL | 0 refills | Status: DC

## 2017-10-19 NOTE — Patient Instructions (Addendum)
Script printed for Dulera 200 to use when travelling  Sample either Advair 250 or Dulera 200 as available  Script printed for Zpak to hold in case needed  Please call if we can help

## 2017-10-19 NOTE — Progress Notes (Signed)
HPI male former smoker followed for allergic rhinitis, asthma, complicated by history CVA, hyperlipidemia, HBP  --------------------------------------  08/29/2016-84 year old male former smoker followed for Allergic rhinitis, Asthma, complicated by history CVA, hyperlipidemia, HBP Allergy Vaccine GO 1:10 follows for a [age]yr old follow up We discussed plans to close our allergy vaccine program here at the end of December and suggested he plan to stop allergy shots when he runs out of vaccine then wait to see how he does. Infrequent need for rescue inhaler and no associated sleep disturbance. He uses either Advair or Dulera, depending on what samples he has, especially when he goes out of town. While at home he depends more on his nebulizer machine for occasional use without maintenance controller. No significant exacerbations since last here.  10/19/17- 84 year old male former smoker followed for Allergic rhinitis, Asthma, complicated by history CVA, hyperlipidemia, HBP ----Asthma; Pt continues using nebulizer and does well. Uses inhalers when traveling. Pt denies any concerns with breathing at this time.  Advair 250, or Dulera 200, pro-air, albuterol neb Uses nebulizer 2 or 3 times daily.  Does not have maintenance controller now.  Asks Z-Pak to hold.  ROS-see HPI   + = positive Constitutional:   No-   weight loss, night sweats, fevers, chills, fatigue, lassitude. HEENT:   No-  headaches, difficulty swallowing, tooth/dental problems, sore throat,       No-  sneezing, itching, ear ache, + recent nasal congestion, post nasal drip,  CV:  No-   chest pain, orthopnea, PND, swelling in lower extremities, anasarca,  dizziness, palpitations Resp: No- acute shortness of breath with exertion or at rest.              No-   productive cough,  No non-productive cough,  No- coughing up of blood.              No-   change in color of mucus.  No- wheezing.   Skin: No-   rash or lesions. GI:  No-   heartburn,  indigestion, abdominal pain, nausea, vomiting,  GU: MS:  No-   joint pain or swelling.   Neuro-     nothing unusual Psych:  No- change in mood or affect. No depression or anxiety.  No memory loss.  OBJ General- Alert, Oriented, Affect-appropriate, Distress- none acute. Very sharp. Skin- rash-none, lesions- none, excoriation- none Lymphadenopathy- none Head- atraumatic            Eyes- Gross vision intact, PERRLA, conjunctivae clear secretions            Ears- Hearing, canals-normal            Nose- Clear, no-Septal dev, mucus, polyps, erosion, perforation             Throat- Mallampati II , mucosa red , drainage- none, tonsils- atrophic Neck- flexible , trachea midline, no stridor , thyroid nl, carotid no bruit Chest - symmetrical excursion , unlabored           Heart/CV- RRR , no murmur , no gallop  , no rub, nl s1 s2                           - JVD- none , edema- none, stasis changes- none, varices- none           Lung- clear to P&A, wheeze- none, cough- none , dullness-none, rub- none           Chest wall-  Abd-  Br/ Gen/ Rectal- Not done, not indicated Extrem- cyanosis- none, clubbing, none, atrophy- none, strength- nl Neuro- grossly intact to observation

## 2017-11-20 NOTE — Assessment & Plan Note (Signed)
[facility]  Hopefully we can reduce his need for home nebulizer treatments. Plan-refill Dulera 200 with sample and prescription.  Education done.  Z-Pak to hold as requested.

## 2017-11-20 NOTE — Assessment & Plan Note (Signed)
Anticipating need for Flonase and and an antihistamine later this Spring.

## 2018-01-09 ENCOUNTER — Other Ambulatory Visit: Payer: Self-pay | Admitting: Internal Medicine

## 2018-03-01 ENCOUNTER — Telehealth: Payer: Self-pay | Admitting: Internal Medicine

## 2018-03-01 NOTE — Telephone Encounter (Signed)
Called patient, unable to reach. Unable to leave voicemail due to mail box not being set up.

## 2018-03-02 MED ORDER — ALBUTEROL SULFATE (2.5 MG/3ML) 0.083% IN NEBU: 2.5000 mg | INHALATION_SOLUTION | Freq: Three times a day (TID) | RESPIRATORY_TRACT | 5 refills | Status: DC

## 2018-03-02 NOTE — Telephone Encounter (Signed)
Spoke with pt and he stated that [facility] has been trying to contact us about a refill on the nebulizer medications. I sent the refill in to [facility] and nothing further is needed.

## 2018-10-19 ENCOUNTER — Ambulatory Visit: Payer: Medicare Other | Admitting: Internal Medicine

## 2018-10-19 ENCOUNTER — Encounter: Payer: Self-pay | Admitting: Internal Medicine

## 2018-10-19 VITALS — BP 142/80 | HR 60 | Ht 72.0 in | Wt 207.0 lb

## 2018-10-19 DIAGNOSIS — J3089 Other allergic rhinitis: Secondary | ICD-10-CM

## 2018-10-19 DIAGNOSIS — J4530 Mild persistent asthma, uncomplicated: Secondary | ICD-10-CM

## 2018-10-19 DIAGNOSIS — J302 Other seasonal allergic rhinitis: Secondary | ICD-10-CM

## 2018-10-19 MED ORDER — AZITHROMYCIN 250 MG PO TABS: ORAL_TABLET | ORAL | 0 refills | Status: DC

## 2018-10-19 MED ORDER — MOMETASONE FURO-FORMOTEROL FUM 200-5 MCG/ACT IN AERO: 2.0000 | INHALATION_SPRAY | Freq: Two times a day (BID) | RESPIRATORY_TRACT | 0 refills | Status: DC

## 2018-10-19 NOTE — Assessment & Plan Note (Signed)
He certainly does not look 87.  Asthma control is great.  Medication talk done. Plan-sample Dulera with refills sent.  Office spirometry done.  Z-Pak to hold.

## 2018-10-19 NOTE — Assessment & Plan Note (Signed)
Mild seasonal rhinitis can be treated with OTC Flonase and antihistamine if needed.  Currently doing well. Plan-Z-Pak to hold at his request with discussion

## 2018-10-19 NOTE — Patient Instructions (Signed)
Order- Office spirometry    Dx asthma mild persistent  Sample Dulera 200 if available   Call for refills as needed  Please call if we can help

## 2018-10-19 NOTE — Progress Notes (Signed)
HPI male former smoker followed for allergic rhinitis, asthma, complicated by history CVA, hyperlipidemia, HBP Office Spirometry 10/19/2018-WNL, mild restriction of exhaled volume.  FVC 3.0/72%, FEV1 2.4/82%, ratio 1.80, FEF 25-75% 2.5/129% --------------------------------------  10/19/17- 85 year old male former smoker followed for Allergic rhinitis, Asthma, complicated by history CVA, hyperlipidemia, HBP ----Asthma; Pt continues using nebulizer and does well. Uses inhalers when traveling. Pt denies any concerns with breathing at this time.  Advair 250, or Dulera 200, pro-air, albuterol neb Uses nebulizer 2 or 3 times daily.  Does not have maintenance controller now.  Asks Z-Pak to hold.  10/19/2018- 85 year old male former smoker followed for Allergic rhinitis, Asthma, complicated by history CVA, hyperlipidemia, HBP -----allergic asthma-breathing well Albuterol HFA, neb albuterol, Advair 250 or Dulera 200 Well-controlled and stable with no recent exacerbation.  Using his nebulizer machine 2 or sometimes 3 times daily.  Uses Dulera or Advair depending on what he can get a sample of, only for travel.  Has not been needing his rescue inhaler at all for a long time. Again asks Z-Pak to hold. He makes a point of staying physically active.  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 Office Spirometry 10/19/2018-WNL, mild restriction of exhaled volume.  FVC 3.0/72%, FEV1 2.4/82%, ratio 1.80, FEF 25-75% 2.5/129%  ROS-see HPI   + = positive Constitutional:   No-   weight loss, night sweats, fevers, chills, fatigue, lassitude. HEENT:   No-  headaches, difficulty swallowing, tooth/dental problems, sore throat,       No-  sneezing, itching, ear ache,  recent nasal congestion, post nasal drip,  CV:  No-   chest pain, orthopnea, PND, swelling in lower extremities, anasarca,  dizziness, palpitations Resp: No- acute shortness of breath with exertion or  at rest.              No-   productive cough,  No non-productive cough,  No- coughing up of blood.              No-   change in color of mucus.  No- wheezing.   Skin: No-   rash or lesions. GI:  No-   heartburn, indigestion, abdominal pain, nausea, vomiting,  GU: MS:  No-   joint pain or swelling.   Neuro-     nothing unusual Psych:  No- change in mood or affect. No depression or anxiety.  No memory loss.  OBJ General- Alert, Oriented, Affect-appropriate, Distress- none acute. Very sharp.  Skin- rash-none, lesions- none, excoriation- none Lymphadenopathy- none Head- atraumatic            Eyes- Gross vision intact, PERRLA, conjunctivae clear secretions            Ears- Hearing, canals-normal            Nose- Clear, no-Septal dev, mucus, polyps, erosion, perforation             Throat- Mallampati II , mucosa red , drainage- none, tonsils- atrophic Neck- flexible , trachea midline, no stridor , thyroid nl, carotid no bruit Chest - symmetrical excursion , unlabored           Heart/CV- RRR , no murmur , no gallop  , no rub, nl s1 s2                           - JVD- none , edema- none, stasis changes- none, varices- none  Lung- clear to P&A, wheeze- none, cough- none , dullness-none, rub- none           Chest wall-  Abd-  Br/ Gen/ Rectal- Not done, not indicated Extrem- cyanosis- none, clubbing, none, atrophy- none, strength- nl Neuro- grossly intact to observation

## 2019-09-02 ENCOUNTER — Other Ambulatory Visit: Payer: Self-pay | Admitting: Internal Medicine

## 2019-10-21 ENCOUNTER — Other Ambulatory Visit: Payer: Self-pay

## 2019-10-21 ENCOUNTER — Ambulatory Visit (INDEPENDENT_AMBULATORY_CARE_PROVIDER_SITE_OTHER): Payer: Medicare HMO | Admitting: Internal Medicine

## 2019-10-21 ENCOUNTER — Ambulatory Visit (INDEPENDENT_AMBULATORY_CARE_PROVIDER_SITE_OTHER): Payer: Medicare HMO

## 2019-10-21 ENCOUNTER — Encounter: Payer: Self-pay | Admitting: Internal Medicine

## 2019-10-21 VITALS — BP 122/76 | HR 62 | Temp 97.4°F | Ht 72.0 in | Wt 217.0 lb

## 2019-10-21 DIAGNOSIS — J302 Other seasonal allergic rhinitis: Secondary | ICD-10-CM | POA: Diagnosis not present

## 2019-10-21 DIAGNOSIS — J3089 Other allergic rhinitis: Secondary | ICD-10-CM | POA: Diagnosis not present

## 2019-10-21 DIAGNOSIS — J4530 Mild persistent asthma, uncomplicated: Secondary | ICD-10-CM

## 2019-10-21 MED ORDER — AZITHROMYCIN 250 MG PO TABS: ORAL_TABLET | ORAL | 2 refills | Status: DC

## 2019-10-21 NOTE — Assessment & Plan Note (Signed)
He is very satisfied with meds and control. Exam notes bibasilar crackles and he admits dry cough. Will get CXR looking for ILD.

## 2019-10-21 NOTE — Assessment & Plan Note (Signed)
Occasional symptoms with poorly defined seasonality. Flonase and an antihistamine should be sufficient.

## 2019-10-21 NOTE — Patient Instructions (Signed)
We can continue current meds  Zpak refill e-sent to your drug store  Order- CXR   Dx asthma mild intermittent uncomplicated  Please call if we can help

## 2019-10-21 NOTE — Progress Notes (Signed)
HPI male former smoker followed for allergic rhinitis, asthma, complicated by history CVA, hyperlipidemia, HBP Office Spirometry 10/19/2018-WNL, mild restriction of exhaled volume.  FVC 3.0/72%, FEV1 2.4/82%, ratio 1.80, FEF 25-75% 2.5/129% --------------------------------------  10/19/2018- 86 year old male former smoker followed for Allergic rhinitis, Asthma, complicated by history CVA, hyperlipidemia, HBP -----allergic asthma-breathing well  Albuterol HFA, neb albuterol, Advair 250 or Dulera 200 Well-controlled and stable with no recent exacerbation.  Using his nebulizer machine 2 or sometimes 3 times daily.  Uses Dulera or Advair depending on what he can get a sample of, only for travel.  Has not been needing his rescue inhaler at all for a long time. Again asks Z-Pak to hold. He makes a point of staying physically active.  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 Office Spirometry 10/19/2018-WNL, mild restriction of exhaled volume.  FVC 3.0/72%, FEV1 2.4/82%, ratio 1.80, FEF 25-75% 2.5/129%  10/21/19- 86 year old male former smoker followed for Allergic rhinitis, Asthma, complicated by history CVA, hyperlipidemia, HBP Albuterol HFA, neb albuterol, Dulera 200, Zpak to hold -----f/u Asthma. Patient's breathing is at his baseline.  Had flu vax and first covid vax. Feels very well controlled.  Takes Elwin Sleight mostly if out of town, using nebulizer at home. Occasional rescue hfa. Had 1 distinct exacerbation last Fall, blamed on "something in the environment". Admits to dry cough noted incidentally.  ROS-see HPI   + = positive Constitutional:   No-   weight loss, night sweats, fevers, chills, fatigue, lassitude. HEENT:   No-  headaches, difficulty swallowing, tooth/dental problems, sore throat,       No-  sneezing, itching, ear ache,  recent nasal congestion, post nasal drip,  CV:  No-   chest pain, orthopnea, PND, swelling in lower  extremities, anasarca,  dizziness, palpitations Resp: No- acute shortness of breath with exertion or at rest.              No-   productive cough, + non-productive cough,  No- coughing up of blood.              No-   change in color of mucus.  No- wheezing.   Skin: No-   rash or lesions. GI:  No-   heartburn, indigestion, abdominal pain, nausea, vomiting,  GU: MS:  No-   joint pain or swelling.   Neuro-     nothing unusual Psych:  No- change in mood or affect. No depression or anxiety.  No memory loss.  OBJ General- Alert, Oriented, Affect-appropriate, Distress- none acute. Very sharp.  Skin- rash-none, lesions- none, excoriation- none Lymphadenopathy- none Head- atraumatic            Eyes- Gross vision intact, PERRLA, conjunctivae clear secretions            Ears- Hearing, canals-normal            Nose- Clear, no-Septal dev, mucus, polyps, erosion, perforation             Throat- Mallampati II , mucosa red , drainage- none, tonsils- atrophic Neck- flexible , trachea midline, no stridor , thyroid nl, carotid no bruit Chest - symmetrical excursion , unlabored           Heart/CV- RRR , no murmur , no gallop  , no rub, nl s1 s2                           - JVD- none ,  edema- none, stasis changes- none, varices- none           Lung- + crackles R>L base, wheeze- none, cough- none , dullness-none, rub- none           Chest wall-  Abd-  Br/ Gen/ Rectal- Not done, not indicated Extrem- cyanosis- none, clubbing, none, atrophy- none, strength- nl Neuro- grossly intact to observation

## 2019-10-31 ENCOUNTER — Other Ambulatory Visit: Payer: Self-pay | Admitting: Internal Medicine

## 2020-09-04 ENCOUNTER — Other Ambulatory Visit: Payer: Self-pay | Admitting: Internal Medicine

## 2020-10-19 NOTE — Progress Notes (Signed)
HPI male former smoker followed for allergic rhinitis, asthma, complicated by history CVA, hyperlipidemia, HBP Office Spirometry 10/19/2018-WNL, mild restriction of exhaled volume.  FVC 3.0/72%, FEV1 2.4/82%, ratio 1.80, FEF 25-75% 2.5/129% ================================================================   10/21/19- 87 year old male light former smoker followed for Allergic rhinitis, Asthma, complicated by history CVA, hyperlipidemia, HBP Albuterol HFA, neb albuterol, Dulera 200, Zpak to hold -----f/u Asthma. Patient's breathing is at his baseline.  Had flu vax and first covid vax. Feels very well controlled.  Takes Elwin Sleight mostly if out of town, using nebulizer at home. Occasional rescue hfa. Had 1 distinct exacerbation last Fall, blamed on "something in the environment". Admits to dry cough noted incidentally.  10/20/20- 87 year old male former smoker followed for Allergic rhinitis, Asthma, complicated by history CVA, hyperlipidemia, HTN, Allergic Rhinitis, Proair HFA, neb albuterol, Dulera 200, Zpak to hold Covid vax- 3 Moderna Flu vax- had Uses neb up to 3x/ daily- works well. Hasn't used any inhalers in long time. Cough is stable,mostly dry and "not bad". 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  CXR 10/21/19- IMPRESSION: Emphysema and chronic lung changes without evidence of acute cardiopulmonary disease  ROS-see HPI   + = positive Constitutional:   No-   weight loss, night sweats, fevers, chills, fatigue, lassitude. HEENT:   No-  headaches, difficulty swallowing, tooth/dental problems, sore throat,       No-  sneezing, itching, ear ache,  recent nasal congestion, post nasal drip,  CV:  No-   chest pain, orthopnea, PND, swelling in lower extremities, anasarca,  dizziness, palpitations Resp: No- acute shortness of breath with exertion or at rest.              No-   productive cough, + non-productive cough,  No- coughing up of blood.              No-    change in color of mucus.  No- wheezing.   Skin: No-   rash or lesions. GI:  No-   heartburn, indigestion, abdominal pain, nausea, vomiting,  GU: MS:  No-   joint pain or swelling.   Neuro-     nothing unusual Psych:  No- change in mood or affect. No depression or anxiety.  No memory loss.  OBJ General- Alert, Oriented, Affect-appropriate, Distress- none acute. Very sharp.  Skin- rash-none, lesions- none, excoriation- none Lymphadenopathy- none Head- atraumatic            Eyes- Gross vision intact, PERRLA, conjunctivae clear secretions            Ears- Hearing, canals-normal            Nose- Clear, no-Septal dev, mucus, polyps, erosion, perforation             Throat- Mallampati II , mucosa red , drainage- none, tonsils- atrophic Neck- flexible , trachea midline, no stridor , thyroid nl, carotid no bruit Chest - symmetrical excursion , unlabored           Heart/CV- RRR , no murmur , no gallop  , no rub, nl s1 s2                           - JVD- none , edema- none, stasis changes- none, varices- none           Lung- + clear,  wheeze- none, cough- none , dullness-none, rub- none  Chest wall-  Abd-  Br/ Gen/ Rectal- Not done, not indicated Extrem- cyanosis- none, clubbing, none, atrophy- none, strength- nl Neuro- grossly intact to observation

## 2020-10-20 ENCOUNTER — Encounter: Payer: Self-pay | Admitting: Internal Medicine

## 2020-10-20 ENCOUNTER — Other Ambulatory Visit: Payer: Self-pay

## 2020-10-20 ENCOUNTER — Ambulatory Visit: Payer: Medicare HMO | Admitting: Internal Medicine

## 2020-10-20 DIAGNOSIS — J3089 Other allergic rhinitis: Secondary | ICD-10-CM

## 2020-10-20 DIAGNOSIS — J4530 Mild persistent asthma, uncomplicated: Secondary | ICD-10-CM | POA: Diagnosis not present

## 2020-10-20 DIAGNOSIS — J302 Other seasonal allergic rhinitis: Secondary | ICD-10-CM

## 2020-10-20 MED ORDER — AZITHROMYCIN 250 MG PO TABS: ORAL_TABLET | ORAL | 2 refills | Status: DC

## 2020-10-20 NOTE — Patient Instructions (Signed)
Glad you are doing so well.  Zpak refilled.  We have left your inhalers on the medication list, but understand you aren't using them much now since the nebulizer does so well for you.  Please call if we can help.

## 2020-11-11 ENCOUNTER — Other Ambulatory Visit: Payer: Self-pay | Admitting: Internal Medicine

## 2020-12-27 NOTE — Assessment & Plan Note (Addendum)
He never smoked much and I suspect emphysema on CXR is mostly senile. He finds neb treatments helpful. Plan continue nebs as needed, he likes to keep a Zpak- discussed

## 2020-12-27 NOTE — Assessment & Plan Note (Signed)
Mild seasonal variation.  Plan- otc if needed

## 2021-09-02 ENCOUNTER — Other Ambulatory Visit: Payer: Self-pay | Admitting: Internal Medicine

## 2021-09-06 NOTE — Telephone Encounter (Signed)
Z pak refilled

## 2021-09-06 NOTE — Telephone Encounter (Signed)
Please clarify why he is requesting antibiotic. This is different from refilling a routine maintenance med.

## 2021-09-13 ENCOUNTER — Other Ambulatory Visit: Payer: Self-pay | Admitting: Internal Medicine

## 2021-10-20 ENCOUNTER — Ambulatory Visit: Payer: Medicare HMO | Admitting: Internal Medicine

## 2021-11-02 ENCOUNTER — Other Ambulatory Visit: Payer: Self-pay

## 2021-11-02 ENCOUNTER — Encounter: Payer: Self-pay | Admitting: Internal Medicine

## 2021-11-02 ENCOUNTER — Ambulatory Visit: Payer: Medicare HMO | Admitting: Internal Medicine

## 2021-11-02 DIAGNOSIS — J3089 Other allergic rhinitis: Secondary | ICD-10-CM | POA: Diagnosis not present

## 2021-11-02 DIAGNOSIS — J302 Other seasonal allergic rhinitis: Secondary | ICD-10-CM | POA: Diagnosis not present

## 2021-11-02 DIAGNOSIS — J4530 Mild persistent asthma, uncomplicated: Secondary | ICD-10-CM | POA: Diagnosis not present

## 2021-11-02 MED ORDER — AZITHROMYCIN 250 MG PO TABS: ORAL_TABLET | ORAL | 1 refills | Status: DC

## 2021-11-02 NOTE — Patient Instructions (Signed)
Zpak refilled  We can continue current meds. We can refill the inhalers if you feel you need. Otherwise, the nebulizer is fine.   Please call if we can help

## 2021-11-02 NOTE — Progress Notes (Signed)
HPI male former smoker followed for allergic rhinitis, asthma, complicated by history CVA, hyperlipidemia, HBP Office Spirometry 10/19/2018-WNL, mild restriction of exhaled volume.  FVC 3.0/72%, FEV1 2.4/82%, ratio 1.80, FEF 25-75% 2.5/129% ================================================================   10/20/20- 88 year old male former smoker followed for Allergic rhinitis, Asthma, complicated by history CVA, hyperlipidemia, HTN, Allergic Rhinitis, Proair HFA, neb albuterol, Dulera 200, Zpak to hold Covid vax- 3 Moderna Flu vax- had Uses neb up to 3x/ daily- works well. Hasn't used any inhalers in long time. Cough is stable,mostly dry and "not bad". 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  We let him hold onto a Z-Pak which he uses rarely to head off trouble. Using his nebulizer machine 2-3 times a day now and not needing inhalers at all except may be for travel.  Feels well today and denies other concerns.  ROS-see HPI   + = positive Constitutional:   No-   weight loss, night sweats, fevers, chills, fatigue, lassitude. HEENT:   No-  headaches, difficulty swallowing, tooth/dental problems, sore throat,       No-  sneezing, itching, ear ache,  recent nasal congestion, post nasal drip,  CV:  No-   chest pain, orthopnea, PND, swelling in lower extremities, anasarca,  dizziness, palpitations Resp: No- acute shortness of breath with exertion or at rest.              No-    productive cough, + non-productive cough,  No- coughing up of blood.              No-   change in color of mucus.  No- wheezing.   Skin: No-   rash or lesions. GI:  No-   heartburn, indigestion, abdominal pain, nausea, vomiting,  GU: MS:  No-   joint pain or swelling.   Neuro-     nothing unusual Psych:  No- change in mood or affect. No depression or anxiety.  No memory loss.  OBJ General- Alert, Oriented, Affect-appropriate, Distress- none acute. Very sharp.  Skin- rash-none, lesions- none, excoriation- none Lymphadenopathy- none Head- atraumatic            Eyes- Gross vision intact, PERRLA, conjunctivae clear secretions            Ears- Hearing, canals-normal            Nose- Clear, no-Septal dev, mucus, polyps, erosion, perforation             Throat- Mallampati II , mucosa red , drainage- none, tonsils- atrophic Neck- flexible , trachea midline, no stridor , thyroid nl, carotid no bruit Chest - symmetrical excursion , unlabored           Heart/CV- RRR , no murmur , no gallop  , no rub, nl s1 s2                           - JVD- none , edema- none, stasis changes- none, varices- none  Lung- + clear,  wheeze- none, cough- none , dullness-none, rub- none           Chest wall-  Abd-  Br/ Gen/ Rectal- Not done, not indicated Extrem- cyanosis- none, clubbing, none, atrophy- none, strength- nl Neuro- grossly intact to observation

## 2021-11-02 NOTE — Assessment & Plan Note (Signed)
Mild persistent uncomplicated.  Nebulizer is sufficient and cheaper than inhalers.  He is quite comfortable and satisfied with current care. Plan-continue using nebulizer machine.  Refills if needed.

## 2021-11-02 NOTE — Assessment & Plan Note (Signed)
We will watch for flare as spring pollen season moves in.  Flonase and antihistamines generally sufficient.

## 2021-11-04 ENCOUNTER — Ambulatory Visit: Payer: Medicare HMO | Admitting: Internal Medicine

## 2022-07-01 ENCOUNTER — Telehealth: Payer: Self-pay | Admitting: Internal Medicine

## 2022-07-01 MED ORDER — AZITHROMYCIN 250 MG PO TABS: ORAL_TABLET | ORAL | 0 refills | Status: DC

## 2022-07-01 NOTE — Telephone Encounter (Signed)
Called and spoke with patient. He stated that he has had a productive cough for the past 3-4 days. His phlegm has been mostly white, clear. Denied any fever, body aches or increased SOB. Also denied being around anyone who has been sick recently.  He has been using OTC cough drops and Zyrtec 10mg .   He wanted to know if CY had any recommendations to get rid of the cough.   [facility] [address].   CY, can you please advise? Thanks!

## 2022-07-01 NOTE — Telephone Encounter (Signed)
Called and spoke with pt letting him know recs per CY and he verbalized understanding. Nothing further needed.

## 2022-07-01 NOTE — Telephone Encounter (Signed)
Please send Zpak 250 mg, # 6, 2 today then one daily He can also use otc Mucinex-DM. If these don't help please let us know.

## 2022-08-17 ENCOUNTER — Encounter: Payer: Self-pay | Admitting: Pulmonary Disease

## 2022-08-17 ENCOUNTER — Ambulatory Visit: Payer: Medicare HMO | Admitting: Pulmonary Disease

## 2022-08-17 VITALS — BP 146/64 | HR 68 | Temp 97.7°F | Ht 73.0 in | Wt 214.6 lb

## 2022-08-17 DIAGNOSIS — J4530 Mild persistent asthma, uncomplicated: Secondary | ICD-10-CM | POA: Diagnosis not present

## 2022-08-17 DIAGNOSIS — J440 Chronic obstructive pulmonary disease with acute lower respiratory infection: Secondary | ICD-10-CM | POA: Diagnosis not present

## 2022-08-17 DIAGNOSIS — J209 Acute bronchitis, unspecified: Secondary | ICD-10-CM

## 2022-08-17 MED ORDER — AZITHROMYCIN 250 MG PO TABS: 250.0000 mg | ORAL_TABLET | Freq: Every day | ORAL | 0 refills | Status: DC

## 2022-08-17 MED ORDER — PREDNISONE 20 MG PO TABS: ORAL_TABLET | ORAL | 0 refills | Status: DC

## 2022-08-17 MED ORDER — IPRATROPIUM-ALBUTEROL 0.5-2.5 (3) MG/3ML IN SOLN: 3.0000 mL | RESPIRATORY_TRACT | 1 refills | Status: DC | PRN

## 2022-08-17 NOTE — Progress Notes (Unsigned)
Manuel [name]    [id number]    [birth year]  Primary Care Physician:Badger, Kayleen Memos, MD  Referring Physician: Eartha Inch, MD [address] [place],  [place] [address]  Chief complaint:   Patient being evaluated for cough, shortness of breath, acute visit today  HPI:  Patient with underlying chronic obstructive pulmonary disease Recently used a course of azithromycin for an exacerbation  He has a cough, congested cough, bringing up clear phlegm Has not had any fevers or chills feels he still has a head cold ongoing  Wheezing at night  Has been using his nebulizer about 3 times a day  Uses Dulera and albuterol nebulizations  He still tries to remain very active  Pets: Occupation: Exposures: Smoking history: Travel history: Relevant family history:  Outpatient Encounter Medications as of 08/17/2022  Medication Sig   albuterol (PROAIR HFA) 108 (90 BASE) MCG/ACT inhaler Inhale 2 puffs into the lungs every 4 (four) hours as needed for wheezing or shortness of breath.   albuterol (PROVENTIL) (2.5 MG/3ML) 0.083% nebulizer solution USE 1 VIAL IN NEBULIZER 3 TIMES DAILY   amLODipine (NORVASC) 5 MG tablet Take 5 mg by mouth at bedtime.   azithromycin (ZITHROMAX) 250 MG tablet Take two today and then one daily until finished.   azithromycin (ZITHROMAX) 250 MG tablet Take 1 tablet (250 mg total) by mouth daily. Take two tablets on day one and then one daily for four days   cetirizine (ZYRTEC) 10 MG tablet Take 10 mg by mouth every morning.   Cholecalciferol 25 MCG (1000 UT) capsule Take by mouth.   clopidogrel (PLAVIX) 75 MG tablet Take 1 tablet by mouth daily.   EPINEPHrine (EPI-PEN) 0.3 mg/0.3 mL SOAJ injection Inject in thigh for severe allergic reaction   famotidine (PEPCID) 20 MG tablet Take 1 tablet by mouth 2 (two) times daily.   ipratropium-albuterol (DUONEB) 0.5-2.5 (3) MG/3ML SOLN Take 3 mLs by nebulization every 4 (four) hours as needed.    losartan-hydrochlorothiazide (HYZAAR) 100-12.5 MG per tablet Take 1 tablet by mouth daily.   mometasone-formoterol (DULERA) 200-5 MCG/ACT AERO 2 puffs then rinse mouth, twice daily   Multiple Vitamins-Minerals (SENIOR MULTIVITAMIN PLUS PO) Take 1 tablet by mouth daily.   Omega-3 Fatty Acids (FISH OIL) 1000 MG CAPS Take by mouth.   predniSONE (DELTASONE) 20 MG tablet 20 mg daily for 7 days   sildenafil (REVATIO) 20 MG tablet TAKE ONE TABLET BY MOUTH EVERY DAY. TAKE NO MORE THAN 3 AT A TIME IF NEEDED   simvastatin (ZOCOR) 40 MG tablet Take 40 mg by mouth at bedtime.   No facility-administered encounter medications on file as of 08/17/2022.    Allergies as of 08/17/2022 - Review Complete 08/17/2022  Allergen Reaction Noted   Codeine Anaphylaxis    Sulfa antibiotics Anaphylaxis and Itching 10/19/2011   Sulfonamide derivatives Anaphylaxis    Yeast-related products Rash 08/04/2011    Past Medical History:  Diagnosis Date   Allergic rhinitis    Asthma    History of CVA (cerebrovascular accident)    HTN (hypertension)    Hyperlipidemia     Past Surgical History:  Procedure Laterality Date   INGUINAL HERNIA REPAIR     KNEE ARTHROSCOPY     bilaterol   TRANSURETHRAL RESECTION OF PROSTATE      Family History  Problem Relation Age of Onset   Cancer Father    Cancer Mother    Cancer Other  sibling   Heart disease Other        sibling    Social History   Socioeconomic History   Marital status: Married    Spouse name: Not on file   Number of children: Not on file   Years of education: Not on file   Highest education level: Not on file  Occupational History   Not on file  Tobacco Use   Smoking status: Former    Packs/day: 0.50    Years: 4.00    Total pack years: 2.00    Types: Cigarettes   Smokeless tobacco: Former    Types: Chew    Quit date: 10/20/1963   Tobacco comments:    Smoked as a teenager  Vaping Use   Vaping Use: Never used  Substance and Sexual  Activity   Alcohol use: No   Drug use: No   Sexual activity: Not on file  Other Topics Concern   Not on file  Social History Narrative   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     Vitals:   08/17/22 1145  BP: (!) 146/64  Pulse: 68  Temp: 97.7 F (36.5 C)  SpO2: 98%     Physical Exam Constitutional:      Appearance: Normal appearance.  HENT:     Head: Normocephalic.     Mouth/Throat:     Mouth: Mucous membranes are moist.  Cardiovascular:     Rate and Rhythm: Normal rate and regular rhythm.     Heart sounds: No murmur heard. Pulmonary:     Effort: No respiratory distress.     Breath sounds: No stridor. No wheezing or rhonchi.  Musculoskeletal:     Cervical back: No rigidity or tenderness.  Neurological:     Mental Status: He is alert.    Data Reviewed: Last chest x-ray was in 2021 showing evidence of emphysema  Assessment:  Obstructive lung disease with exacerbation  Past history of asthma  Head cold  Plan/Recommendations: Z-Pak prescription Course of prednisone 20 mg daily for 7 days  Prescription for DuoNeb sent to pharmacy for him  N-acetylcysteine-over-the-counter may be tried to help mucus clearance  Encouraged to call with any significant concerns  Keep appointment with Dr. Michel Santee MD [facility] 08/17/2022, 12:03 PM  CC: Eartha Inch, MD

## 2022-08-17 NOTE — Patient Instructions (Signed)
Will send your prescription for azithromycin  Prednisone 20 mg daily for 7 days  Prescription for DuoNeb to be used in your nebulizer, can be used up to 4 times a day-this does have albuterol and it-try not to use them together  N-acetylcysteine -Cough medicine, helps break down mucus,  one tab twice a day -You can obtain this at [facility] as we both looked at online, its in [address]  Keep appointment with Dr. Maple Hudson

## 2022-09-14 ENCOUNTER — Other Ambulatory Visit: Payer: Self-pay | Admitting: Pulmonary Disease

## 2022-09-19 ENCOUNTER — Other Ambulatory Visit: Payer: Self-pay | Admitting: Pulmonary Disease

## 2022-09-19 ENCOUNTER — Other Ambulatory Visit: Payer: Self-pay | Admitting: Internal Medicine

## 2022-10-10 ENCOUNTER — Telehealth: Payer: Self-pay | Admitting: Internal Medicine

## 2022-10-10 MED ORDER — PREDNISONE 10 MG PO TABS: ORAL_TABLET | ORAL | 0 refills | Status: DC

## 2022-10-10 MED ORDER — DOXYCYCLINE HYCLATE 100 MG PO TABS: 100.0000 mg | ORAL_TABLET | Freq: Two times a day (BID) | ORAL | 0 refills | Status: DC

## 2022-10-10 MED ORDER — BENZONATATE 200 MG PO CAPS: 200.0000 mg | ORAL_CAPSULE | Freq: Three times a day (TID) | ORAL | 1 refills | Status: DC | PRN

## 2022-10-10 NOTE — Telephone Encounter (Signed)
Was in a bit ago and states Dr. Ander Slade was going to send a script in for him but they did not have it at the Pharm. Has an ongoing cough and would like something called in for relief before his Feb appt. Pls call @ [phone]   AVS Says the following:  Will send your prescription for azithromycin   Prednisone 20 mg daily for 7 days   Prescription for DuoNeb to be used in your nebulizer, can be used up to 4 times a day-this does have albuterol and it-try not to use them together   N-acetylcysteine -Cough medicine, helps break down mucus,  one tab twice a day -You can obtain this at [facility] as we both looked at online, its in [address]

## 2022-10-10 NOTE — Telephone Encounter (Signed)
Spoke with pt and reviewed recommendations as stated by Dr. Annamaria Boots along with medication education. Pt stated understanding and verified pharmacy. Medication orders placed. Nothing further needed at this time.

## 2022-10-10 NOTE — Telephone Encounter (Signed)
Doxycycline 100 mg, # 14, 1 twice daily  Prednisone 10 mg, # 20   4 X 2 DAYS, 3 X 2 DAYS, 2 X 2 DAYS, 1 X 2 DAYS   Tessalon perles 200 mg. # 40, 1 every 8 hours as needed for cough

## 2022-10-10 NOTE — Telephone Encounter (Signed)
Spoke with pt who states increased productive cough and increased wheezing. Pt denies SOB/ GI upset/ sore throat/ fever/ chills. Pt is not currently taking in OTC. Pt

## 2022-10-10 NOTE — Telephone Encounter (Signed)
Pt is scheduled for OV with Dr. Annamaria Boots on 11/03/22. Dr. Annamaria Boots please advise.

## 2022-11-02 NOTE — Progress Notes (Signed)
HPI male former smoker followed for allergic rhinitis, asthma, complicated by history CVA, hyperlipidemia, HBP Office Spirometry 10/19/2018-WNL, mild restriction of exhaled volume.  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  We let him hold onto a Z-Pak which he uses rarely to head off trouble. Using his nebulizer machine 2-3 times a day now and not needing inhalers at all except may be for travel.  Feels well today and denies other concerns.  11/03/22- 89 year old male former smoker followed for Allergic rhinitis, Asthma/ COPD, complicated by history CVA, hyperlipidemia, HTN, Allergic Rhinitis, Proair HFA, neb albuterol, Dulera 200, Zpak to hold Covid vax- 3 Moderna Flu vax- had LOV  08/17/22 Dr Ander Slade Acute visit- > prednisone 20qdx 7d, refilled DuoNeb, N-acetylcysteine. We then sent doxy, Pred8dtaper , tessalon perles 1/22. -----Pt is doing much better  He feels back at baseline today.  Tries to stay physically active.  No routine phlegm and minimal cough.  ROS-see HPI   + = positive Constitutional:   No-   weight loss, night sweats, fevers, chills, fatigue, lassitude. HEENT:   No-  headaches, difficulty swallowing, tooth/dental problems, sore throat,       No-  sneezing, itching, ear ache,  recent nasal congestion, post nasal drip,  CV:  No-   chest pain, orthopnea, PND, swelling in lower extremities, anasarca,  dizziness, palpitations Resp: No- acute shortness of breath with exertion or at rest.              No-   productive  cough, + non-productive cough,  No- coughing up of blood.              No-   change in color of mucus.  No- wheezing.   Skin: No-   rash or lesions. GI:  No-   heartburn, indigestion, abdominal pain, nausea, vomiting,  GU: MS:  No-   joint pain or swelling.   Neuro-     nothing unusual Psych:  No- change in mood or affect. No depression or anxiety.  No memory loss.  OBJ General- Alert, Oriented, Affect-appropriate, Distress- none acute. Very sharp.  Skin- rash-none, lesions- none, excoriation- none Lymphadenopathy- none Head- atraumatic            Eyes- Gross vision intact, PERRLA, conjunctivae clear secretions            Ears- Hearing, canals-normal            Nose- Clear, no-Septal dev, mucus, polyps, erosion, perforation             Throat- Mallampati II , mucosa red , drainage- none, tonsils- atrophic Neck- flexible , trachea midline, no stridor , thyroid nl, carotid no bruit Chest - symmetrical excursion , unlabored           Heart/CV- RRR , no murmur , no gallop  , no rub, nl s1 s2                           - JVD- none , edema- none, stasis changes- none, varices- none  Lung- + few crackles right base,  wheeze- none, cough- none , dullness-none, rub- none           Chest wall-  Abd-  Br/ Gen/ Rectal- Not done, not indicated Extrem- cyanosis- none, clubbing, none, atrophy- none, strength- nl Neuro- grossly intact to observation

## 2022-11-03 ENCOUNTER — Ambulatory Visit: Payer: Medicare HMO | Admitting: Internal Medicine

## 2022-11-03 ENCOUNTER — Ambulatory Visit (INDEPENDENT_AMBULATORY_CARE_PROVIDER_SITE_OTHER): Payer: Medicare HMO

## 2022-11-03 ENCOUNTER — Encounter: Payer: Self-pay | Admitting: Internal Medicine

## 2022-11-03 VITALS — BP 130/68 | HR 84 | Ht 73.0 in | Wt 216.0 lb

## 2022-11-03 DIAGNOSIS — J4530 Mild persistent asthma, uncomplicated: Secondary | ICD-10-CM

## 2022-11-03 DIAGNOSIS — J4489 Other specified chronic obstructive pulmonary disease: Secondary | ICD-10-CM

## 2022-11-03 DIAGNOSIS — J3089 Other allergic rhinitis: Secondary | ICD-10-CM | POA: Diagnosis not present

## 2022-11-03 DIAGNOSIS — J302 Other seasonal allergic rhinitis: Secondary | ICD-10-CM

## 2022-11-03 NOTE — Patient Instructions (Signed)
Order- CXR- dx Asthma-COPD overlap  Order- DME [facility]- refill Duoneb neb solution x 12 months

## 2022-11-07 ENCOUNTER — Telehealth: Payer: Self-pay | Admitting: Internal Medicine

## 2022-11-07 MED ORDER — IPRATROPIUM-ALBUTEROL 0.5-2.5 (3) MG/3ML IN SOLN: 3.0000 mL | RESPIRATORY_TRACT | 5 refills | Status: DC | PRN

## 2022-11-07 NOTE — Telephone Encounter (Signed)
Could you please send him a Zpak?- Thanks

## 2022-11-07 NOTE — Telephone Encounter (Signed)
Deneise Lever, MD 11/07/2022  1:25 PM EST     CXR- looks ok, stable with no active concern.   Called and spoke with patient. He verbalized understanding of results.   He also stated that he needed to have a RX for the Duoneb solution to be sent to [facility]. I have sent this in for him.   He wanted to know if Dr. Annamaria Boots would be willing to send in a zpak for him to have on hand at his pharmacy in case of any flare ups. He stated Dr. Annamaria Boots used to do this for him and he forgot to mention it during his last OV.   Dr. Annamaria Boots, can you please advise? Thanks!

## 2022-11-07 NOTE — Telephone Encounter (Signed)
PT here in front at desk. States on last visit Dr. Annamaria Boots was to call in the following to [facility]:  Order- DME [facility]- refill Duoneb neb solution x 12 months   Also, Dr. Annamaria Boots also sends in an RX on a Zpack for any future flair ups to [facility] on [address]. .  Also Wants results on Xray Last Thurs. Needs results. Pls call to advise.

## 2022-11-08 MED ORDER — AZITHROMYCIN 250 MG PO TABS: ORAL_TABLET | ORAL | 0 refills | Status: DC

## 2022-11-08 NOTE — Telephone Encounter (Signed)
Called and spoke with patient. He is aware CY is ok with the zpak being sent. RX has been sent.   Nothing further needed at time of call.

## 2022-11-17 ENCOUNTER — Telehealth: Payer: Self-pay | Admitting: Internal Medicine

## 2022-11-17 NOTE — Telephone Encounter (Signed)
Patient states [facility] does not have the Douneb Nebulizer solution. Just about out of medication. Patient phone number is [phone].

## 2022-11-18 NOTE — Telephone Encounter (Signed)
[facility] and spoke with Gerald Stabs. He stated that he could see the order but it was in another system. He was able to pull the order and process it. He confirmed that he would reach out to the patient.   Spoke with the patient in the lobby and confirmed that [facility] had the order. While on the phone, Gerald Stabs actually called the patient as promised. They will ship the medication out today. Patient verbalized understanding.   Nothing further needed at time of call.

## 2022-11-24 ENCOUNTER — Telehealth: Payer: Self-pay | Admitting: Internal Medicine

## 2022-11-24 MED ORDER — PREDNISONE 10 MG PO TABS: ORAL_TABLET | ORAL | 0 refills | Status: DC

## 2022-11-24 NOTE — Telephone Encounter (Signed)
Patient states still having symptoms of wheezing and cough. Pharmacy is [facility]. Patient phone number is [phone].

## 2022-11-24 NOTE — Telephone Encounter (Signed)
Prednisone 10, # 20, 4 X 2 DAYS, 3 X 2 DAYS, 2 X 2 DAYS, 1 X 2 DAYS

## 2022-11-24 NOTE — Telephone Encounter (Signed)
Spoke with the pt and notified of response per Dr Annamaria Boots  He verbalized understanding  Rx was sent to pharm

## 2022-11-24 NOTE — Telephone Encounter (Signed)
Spoke with the pt  He reports increased cough- non prod and wheezing over the past few days  Denies any fevers, aches, chest tightness, increased SOB  He is using his albuterol neb QID  Dr Annamaria Boots, please advise, thanks!  Allergies  Allergen Reactions   Codeine Anaphylaxis   Sulfa Antibiotics Anaphylaxis and Itching   Sulfonamide Derivatives Anaphylaxis   Yeast-Related Products Rash

## 2022-11-30 NOTE — Assessment & Plan Note (Signed)
Recent acute bronchitis now resolved, back at baseline. Plan-CXR.  Refilled nebulizer solution.

## 2022-11-30 NOTE — Assessment & Plan Note (Signed)
Not experiencing significant allergic rhinitis yet ahead of spring tree pollens.  Can turn to Flonase and antihistamines if needed.

## 2023-02-03 ENCOUNTER — Other Ambulatory Visit: Payer: Self-pay | Admitting: Internal Medicine

## 2023-02-14 ENCOUNTER — Telehealth: Payer: Self-pay | Admitting: Internal Medicine

## 2023-02-14 NOTE — Telephone Encounter (Signed)
Left message for patient to call back  

## 2023-02-14 NOTE — Telephone Encounter (Signed)
PT would like Dr. Jeannie Fend to call in some Pred and a Zpack for him. Not feeling well and this is what Dr. Rhina Brackett to the Pharm for him last time.  Sunday morning he had a very bad Asthma Attack and he did not feel he was going to get thru it. Primary symptom is cough. Coughing up some but wishes it was more productive.  [facility]   [phone]

## 2023-02-15 MED ORDER — PREDNISONE 10 MG PO TABS: ORAL_TABLET | ORAL | 0 refills | Status: DC

## 2023-02-15 MED ORDER — AZITHROMYCIN 250 MG PO TABS: ORAL_TABLET | ORAL | 0 refills | Status: DC

## 2023-02-15 MED ORDER — ALBUTEROL SULFATE HFA 108 (90 BASE) MCG/ACT IN AERS: 2.0000 | INHALATION_SPRAY | RESPIRATORY_TRACT | 99 refills | Status: DC | PRN

## 2023-02-15 NOTE — Telephone Encounter (Signed)
Called and spoke with patient. He stated that he has dealing with an asthma exacerbation for the past few days. He has a non-productive cough but feels like he has some congestion in his chest. Slight increase in wheezing and SOB. Denied any fevers or body aches. He has been using Mucinex but it is not helping. Confirmed that he is using the Duoneb solution as needed.   He wanted to know if Dr. Maple Hudson would be willing to send in a zpak and prednisone taper. He has done this before in the past and they have helped.  [facility]acy is [facility] on [address].   Dr. Maple Hudson, can you please advise? Thanks!

## 2023-02-15 NOTE — Telephone Encounter (Signed)
Scripts sent for Zpak and prednisone taper. I also refilled his albuterol rescue inhaler, which had expired.

## 2023-02-15 NOTE — Telephone Encounter (Signed)
PT frustrated no call back. I let him know we did try to call back yesterday. Older man expressing frustration. I let him know we'd put another message back for him to be called.   Sickie so sending HP.

## 2023-02-16 NOTE — Telephone Encounter (Signed)
Spoke with patient. Advised zpak, prednisone and antibiotic has been sent to pharmacy. NFN

## 2023-03-13 ENCOUNTER — Other Ambulatory Visit: Payer: Self-pay | Admitting: Internal Medicine

## 2023-08-14 ENCOUNTER — Telehealth: Payer: Self-pay | Admitting: Internal Medicine

## 2023-08-14 NOTE — Telephone Encounter (Signed)
Patient called for an appointment, nothing available with NP's or Dr. Maple Hudson. Patient has been coughing and wheezing for about 2-3 weeks, doesn't want it to turn into pneumonia. Please advise on next steps.

## 2023-08-14 NOTE — Telephone Encounter (Signed)
Suggest we send Rx:  Prednisone 10 mg, # 20, 4 X 2 DAYS, 3 X 2 DAYS, 2 X 2 DAYS, 1 X 2 DAYS  Zpak 250 mg, # 6, 2 today then one daily  Benzonatate perles 200 mg, # 30,   1 every 8 hours as needed for cough

## 2023-08-15 MED ORDER — BENZONATATE 200 MG PO CAPS: 200.0000 mg | ORAL_CAPSULE | Freq: Three times a day (TID) | ORAL | 1 refills | Status: DC | PRN

## 2023-08-15 MED ORDER — AZITHROMYCIN 250 MG PO TABS: ORAL_TABLET | ORAL | 0 refills | Status: DC

## 2023-08-15 MED ORDER — PREDNISONE 10 MG PO TABS: ORAL_TABLET | ORAL | 0 refills | Status: DC

## 2023-08-15 NOTE — Telephone Encounter (Signed)
Atc pt no answer, medications have been sent in. Lvmm for pt to give the office a call back

## 2023-08-15 NOTE — Telephone Encounter (Signed)
Atc x2 no answer, this time unable to lvmm. Please inform pt his mediation has been sent to the pharmacy when he calls back

## 2023-08-15 NOTE — Telephone Encounter (Signed)
Pt returning missed call, pls advise

## 2023-08-16 NOTE — Telephone Encounter (Signed)
Reached PT and let him know. NFN.

## 2023-11-04 NOTE — Progress Notes (Unsigned)
 HPI male former smoker followed for allergic rhinitis, asthma, complicated by history CVA, hyperlipidemia, HBP Office Spirometry 10/19/2018-WNL, mild restriction of exhaled volume.  FVC 3.0/72%, FEV1 2.4/82%, ratio 1.80, FEF 25-75% 2.5/129% ================================================================    11/03/22- 89 year old male former smoker followed for Allergic rhinitis, Asthma/ COPD, complicated by history CVA, hyperlipidemia, HTN, Allergic Rhinitis, Proair HFA, neb albuterol, Dulera 200, Zpak to hold Covid vax- 3 Moderna Flu vax- had LOV  08/17/22 Dr Wynona Neat Acute visit- > prednisone 20qdx 7d, refilled DuoNeb, N-acetylcysteine. We then sent doxy, Pred8dtaper , tessalon perles 1/22. -----Pt is doing much better  He feels back at baseline today.  Tries to stay physically active.  No routine phlegm and minimal cough.  11/06/23-  89 year old male former smoker followed for Allergic rhinitis, Asthma/ COPD, complicated by history CVA, hyperlipidemia, HTN, Allergic Rhinitis, Ascending Aortic Aneurysm,  Proair HFA, neb albuterol, Dulera 200, Zpak to hold Doing very well. Asks Zpak to hold.  His PCP asked I look at [facility] chest CT from December. Report available and reviewed. Images not available.Some RUL small airways inflammation might be an atypical infection, but unlikely to represent any urgent process. We can watch this. Discussed the use of AI scribe software for clinical note transcription with the patient, who gave verbal consent to proceed.  History of Present Illness   The patient, with a history of chronic bronchitis, presents for follow-up after a recent exacerbation. He was prescribed prednisone, a Z-Pak, and an inhaler, which improved his symptoms. He reports intermittent coughing spells, particularly at night, sometimes productive of clear or colored sputum. He denies any associated fever or night sweats. He also reports a history of wheezing at night, which has improved with the  use of an inhaler. He continues using Dulera 200 and is using his albuterol up to 4x/ day. We will try Breztri samples.  In addition, the patient had a CT scan in November, which showed fibrotic changes or scarring in the lungs. The patient denies any new respiratory symptoms since the scan.      CT chest ([facility]) 09/08/23 FINDINGS:  Scattered atelectasis or scarring/fibrotic change in bilateral lungs. No overt focal consolidation or infiltrate clearly identified. No large effusion or pneumothorax seen. Central airways are grossly patent. Mild peripheral scattered tree-in-bud nodular changes; series 3 image 131 the right upper lobe as an example.  Upper neck/thoracic inlet is without definite acute abnormality. No definite supraclavicular or axillary adenopathy identified. Heart size is stable. No large pericardial effusion seen. Ascending aorta is mildly aneurysmal measuring 4.1 cm. Moderate to severe coronary calcifications. Shotty lymph nodes in the mediastinum/hilar regions. Small hiatal hernia. Please see same-day CT abdomen for details. Degenerative osseous changes. No definite displaced fracture seen.  IMPRESSION:  1. Mild scattered right upper lobe peripheral tree-in-bud nodularity changes; this could be from early infiltrate.  2.  Scattered atelectasis or scarring/chronic appearing interstitial/fibrotic change. No focal large consolidation or infiltrate seen.   Assessment and Plan:    Chronic Bronchitis Persistent cough, especially at night, with occasional production of clear or colored sputum. No associated night sweats or fevers. Lung exam revealed slight raspy sounds. Currently on Dulera inhaler and nebulizer with albuterol and ipratropium. -Continue Dulera inhaler, two puffs twice a day, and nebulizer as needed. -Trial of Breztri inhaler (sample provided) to assess if additional ingredient improves symptoms. Patient to provide feedback on effectiveness.  Lung Nodule CT scan from  November reports fibrotic changes and possible early pneumonia at that time. No current symptoms suggestive of pneumonia.  No further action required at this time.  Follow-up in 6 months or sooner if needed.         ROS-see HPI   + = positive Constitutional:   No-   weight loss, night sweats, fevers, chills, fatigue, lassitude. HEENT:   No-  headaches, difficulty swallowing, tooth/dental problems, sore throat,       No-  sneezing, itching, ear ache,  recent nasal congestion, post nasal drip,  CV:  No-   chest pain, orthopnea, PND, swelling in lower extremities, anasarca,  dizziness, palpitations Resp: No- acute shortness of breath with exertion or at rest.              +  productive cough, + non-productive cough,  No- coughing up of blood.              No-   change in color of mucus.  No- wheezing.   Skin: No-   rash or lesions. GI:  No-   heartburn, indigestion, abdominal pain, nausea, vomiting,  GU: MS:  No-   joint pain or swelling.   Neuro-     nothing unusual Psych:  No- change in mood or affect. No depression or anxiety.  No memory loss.  OBJ General- Alert, Oriented, Affect-appropriate, Distress- none acute. Very sharp.  Skin- rash-none, lesions- none, excoriation- none Lymphadenopathy- none Head- atraumatic            Eyes- Gross vision intact, PERRLA, conjunctivae clear secretions            Ears- Hearing, canals-normal            Nose- Clear, no-Septal dev, mucus, polyps, erosion, perforation             Throat- Mallampati II , mucosa red , drainage- none, tonsils- atrophic Neck- flexible , trachea midline, no stridor , thyroid nl, carotid no bruit Chest - symmetrical excursion , unlabored           Heart/CV- RRR ,  murmur , no gallop  , no rub, nl s1 s2                           - JVD- none , edema- none, stasis changes- none, varices- none           Lung- + few crackles mid back,  wheeze- none, cough- none , dullness-none, rub- none           Chest wall-  Abd-  Br/  Gen/ Rectal- Not done, not indicated Extrem- cyanosis- none, clubbing, none, atrophy- none, strength- nl Neuro- grossly intact to observation  Assessment and Plan    Chronic Bronchitis Persistent cough, especially at night, with occasional production of clear or colored sputum. No associated night sweats or fevers. Lung exam revealed slight raspy sounds. Currently on Dulera inhaler and nebulizer with albuterol and ipratropium. -Continue Dulera inhaler, two puffs twice a day, and nebulizer as needed. -Trial of Breztri inhaler (sample provided) to assess if additional ingredient improves symptoms. Patient to provide feedback on effectiveness.  Lung Nodule CT scan from November reports fibrotic changes and possible early pneumonia at that time. No current symptoms suggestive of pneumonia. No further action required at this time.  Follow-up in 6 months or sooner if needed.

## 2023-11-06 ENCOUNTER — Ambulatory Visit: Payer: Medicare Other | Admitting: Internal Medicine

## 2023-11-06 ENCOUNTER — Encounter: Payer: Self-pay | Admitting: Internal Medicine

## 2023-11-06 VITALS — BP 124/66 | HR 63 | Temp 97.7°F | Ht 73.0 in | Wt 198.6 lb

## 2023-11-06 DIAGNOSIS — J449 Chronic obstructive pulmonary disease, unspecified: Secondary | ICD-10-CM | POA: Diagnosis not present

## 2023-11-06 MED ORDER — BREZTRI AEROSPHERE 160-9-4.8 MCG/ACT IN AERO: 2.0000 | INHALATION_SPRAY | Freq: Two times a day (BID) | RESPIRATORY_TRACT | Status: DC

## 2023-11-06 MED ORDER — AZITHROMYCIN 250 MG PO TABS: ORAL_TABLET | ORAL | 0 refills | Status: DC

## 2023-11-06 NOTE — Patient Instructions (Signed)
 Order- sample x 2 Breztri inhaler   inhale 2 puffs then rinse mouth, twice daily. Try this for a while instead of the dark-blue Dulera. If you think the Manuel [name] is definitely better, then let me know and we can send a prescription. Otherwise, stay with your [facility].  I looked at the report of your recent CT scan and don't think there is a concern. I can't see the actual images myself.

## 2024-01-17 NOTE — Patient Instructions (Signed)

## 2024-01-17 NOTE — Progress Notes (Unsigned)
 [address]       [place] [address]             [phone]        TEGH BRYS [id number] Feb 07, 1931  History of Present Illness: Manuel [name] is a 89 year old male with a past medical history of HTN, HLD, CVA, and asthma. He was incidentally found to have a 4.1cm ascending aortic aneurysm on chest CT after his CXR showed a tortuous aorta and further workup was done by his PCP.   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 He is very active in his church and feels quality of like is very important. He denies chest pain, shortness of breath, dizziness and LOC. He does admit to lower extremity edema.  Current Outpatient Medications on File Prior to Visit  Medication Sig Dispense Refill   albuterol  (PROAIR  HFA) 108 (90 Base) MCG/ACT inhaler Inhale 2 puffs into the lungs every 4 (four) hours as needed for wheezing or shortness of breath. 1 each prn   amLODipine (NORVASC) 5 MG tablet Take 5 mg by mouth at bedtime.     azithromycin  (ZITHROMAX ) 250 MG tablet Take 2 the first day and 1 each day after 6 tablet 0   benzonatate  (TESSALON ) 200 MG capsule Take 1 capsule (200 mg total) by mouth 3 (three) times daily as needed for cough. 30 capsule 1   Budeson-Glycopyrrol-Formoterol  (BREZTRI  AEROSPHERE) 160-9-4.8 MCG/ACT AERO Inhale 2 puffs into the lungs in the morning and at bedtime.     cetirizine (ZYRTEC) 10 MG tablet Take 10 mg by mouth every morning.     Cholecalciferol 25 MCG (1000 UT) capsule Take by mouth.     clopidogrel (PLAVIX) 75 MG tablet Take 1 tablet by mouth daily.     EPINEPHrine  (EPI-PEN) 0.3 mg/0.3 mL SOAJ injection Inject in thigh for severe allergic reaction 1 Device prn   famotidine (PEPCID) 20 MG tablet Take 1 tablet by mouth 2 (two) times daily.     ipratropium-albuterol  (DUONEB) 0.5-2.5 (3) MG/3ML SOLN Take 3 mLs by nebulization every 4 (four) hours as needed. 360 mL  5   losartan-hydrochlorothiazide (HYZAAR) 100-12.5 MG per tablet Take 1 tablet by mouth daily.     mometasone -formoterol  (DULERA) 200-5 MCG/ACT AERO 2 puffs then rinse mouth, twice daily 1 Inhaler 12   Multiple Vitamins-Minerals (SENIOR MULTIVITAMIN PLUS PO) Take 1 tablet by mouth daily.     Omega-3 Fatty Acids (FISH OIL) 1000 MG CAPS Take by mouth.     sildenafil (REVATIO) 20 MG tablet TAKE ONE TABLET BY MOUTH EVERY DAY. TAKE NO MORE THAN 3 AT A TIME IF NEEDED     simvastatin (ZOCOR) 40 MG tablet Take 40 mg by mouth at bedtime.     No current facility-administered medications on file prior to visit.   Vitals: Today's Vitals   01/23/24 1400  BP: 122/70  Pulse: 62  Resp: 18  SpO2: 98%  Weight: 194 lb (88 kg)  Height: 6\' 1"  (1.854 m)   Body mass index is 25.6 kg/m.  Review of Systems  Constitutional:  Positive for weight loss. Negative for malaise/fatigue.       Purposeful weight loss due to prediabetes  HENT:  Positive for hearing loss.        Dentures  Respiratory:  Positive for cough, shortness of breath and wheezing.  Asthma  Cardiovascular:  Positive for leg swelling. Negative for chest pain.  Gastrointestinal:  Negative for heartburn, nausea and vomiting.  Musculoskeletal:        Some leg cramps  Neurological:  Negative for dizziness, loss of consciousness and weakness.  Endo/Heme/Allergies:  Bruises/bleeds easily.   Physical Exam General: Alert and oriented, no acute distress Neuro: Grossly intact CV: regular rate and rhythm, no murmur Pulm: Clear to auscultation bilaterally GI: +BS, nontender Extremities: 1+ edema bilateral lower extremities  CT Results:  IMPRESSION: 1.  Mild scattered right upper lobe peripheral tree-in-bud nodularity changes; this could be from early infiltrate. 2.  Scattered atelectasis or scarring/chronic appearing interstitial/fibrotic change. No focal large consolidation or infiltrate seen.  Electronically Signed by: Dia Forget, MD  on 09/08/2023 3:07 PM   COMPARISON: None. INDICATION: Abnormal lung scan TECHNIQUE:  CT CHEST WO CONTRAST - Contrast: Radiation dose reduction was utilized (automated exposure control, mA or kV adjustment based on patient size, or iterative image reconstruction).  Exam date/time: 09/08/2023 2:24 PM  FINDINGS:  Scattered atelectasis or scarring/fibrotic change in bilateral lungs. No overt focal consolidation or infiltrate clearly identified. No large effusion or pneumothorax seen. Central airways are grossly patent. Mild peripheral scattered tree-in-bud nodular changes; series 3 image 131 the right upper lobe as an example.  Upper neck/thoracic inlet is without definite acute abnormality. No definite supraclavicular or axillary adenopathy identified. Heart size is stable. No large pericardial effusion seen. Ascending aorta is mildly aneurysmal measuring 4.1 cm. Moderate to severe coronary calcifications. Shotty lymph nodes in the mediastinum/hilar regions. Small hiatal hernia. Please see same-day CT abdomen for details. Degenerative osseous changes. No definite displaced fracture seen.  Impression and Plan: ATAA: Manuel [name] presents to the clinic with a 4.1cm ascending aortic aneurysm, for his age this is a borderline aneurysm.  He has not undergone an echocardiogram in the past.  We discussed the natural history and and risk factors for growth of ascending aortic aneurysms.  We covered the importance of tight blood pressure control, refraining from lifting heavy objects, and avoiding fluoroquinolones. The patient is aware of signs and symptoms of aortic dissection and when to present to the emergency department. He does not meet surgical threshold of 5.5cm at this time and admits that he likely would not want surgery. He does want to continue annual surveillance at this time. We will continue surveillance, plan to have the patient return to the clinic with chest CTA in 1 year.   Coronary  calcifications: Continue simvastatin, continue follow up with PCP  Atelectasis/fibrotic change in bilateral lungs: Continue follow up with pulmonology  Risk Modification:  Statin:  Simvastatin  Smoking cessation instruction/counseling given: quit 72 years ago  Patient was counseled on importance of Blood Pressure Control.  Despite Medical intervention if the patient notices persistently elevated blood pressure readings.  They are instructed to contact their Primary Care Physician  Please avoid use of Fluoroquinolones as this can potentially increase your risk of Aortic Rupture and/or Dissection  Patient educated on signs and symptoms of Aortic Dissection, handout also provided in AVS  Randa Burton, PA-C 01/17/24

## 2024-01-19 ENCOUNTER — Other Ambulatory Visit: Payer: Self-pay | Admitting: Physician Assistant

## 2024-01-23 ENCOUNTER — Encounter: Payer: Self-pay | Admitting: Physician Assistant

## 2024-01-23 ENCOUNTER — Ambulatory Visit: Attending: Thoracic Surgery (Cardiothoracic Vascular Surgery) | Admitting: Physician Assistant

## 2024-01-23 VITALS — BP 122/70 | HR 62 | Resp 18 | Ht 73.0 in | Wt 194.0 lb

## 2024-01-23 DIAGNOSIS — I7121 Aneurysm of the ascending aorta, without rupture: Secondary | ICD-10-CM

## 2024-03-07 ENCOUNTER — Other Ambulatory Visit: Payer: Self-pay | Admitting: Internal Medicine

## 2024-03-08 ENCOUNTER — Telehealth: Payer: Self-pay | Admitting: Internal Medicine

## 2024-03-08 NOTE — Telephone Encounter (Signed)
 Fax rc'd from [facility] needing Dr. Al[place]an [place]. Will fwd to Dr. Linder Revere.

## 2024-03-13 NOTE — Telephone Encounter (Signed)
 Will fax to [phone] to [facility]. Once confirmed will send to scan.

## 2024-03-13 NOTE — Telephone Encounter (Signed)
 Rc'd signed copy

## 2024-03-14 ENCOUNTER — Other Ambulatory Visit: Payer: Self-pay | Admitting: Internal Medicine

## 2024-04-11 ENCOUNTER — Other Ambulatory Visit: Payer: Self-pay | Admitting: Internal Medicine

## 2024-05-06 ENCOUNTER — Ambulatory Visit: Payer: Medicare Other | Admitting: Internal Medicine

## 2024-05-14 ENCOUNTER — Other Ambulatory Visit: Payer: Self-pay | Admitting: Internal Medicine

## 2024-06-11 ENCOUNTER — Ambulatory Visit: Payer: Self-pay

## 2024-06-11 NOTE — Telephone Encounter (Signed)
 FYI Only or Action Required?: FYI only for provider.  Patient is followed in Pulmonology for Asthma, last seen on 11/06/2023 by Neysa Reggy BIRCH, MD.  Called Nurse Triage reporting No chief complaint on file..  Symptoms began several days ago.  Interventions attempted: Increased fluids/rest.  Symptoms are: gradually worsening.  Triage Disposition: See PCP When Office is Open (Within 3 Days)  Patient/caregiver understands and will follow disposition?: Yes   Copied from CRM #[id number]. Topic: Clinical - Red Word Triage >> Jun 11, 2024  1:44 PM Manuel [name] wrote: [facility] that prompted transfer to Nurse Triage: patient experiencing sudden onset sore throat and cough Reason for Disposition  [1] Sore throat with cough/cold symptoms AND [2] present > 5 days  Answer Assessment - Initial Assessment Questions 1. ONSET: When did the throat start hurting? (Hours or days ago)       Several days ago  2. SEVERITY: How bad is the sore throat? (Scale 1-10; mild, moderate or severe)     Mild to Moderate  3. STREP EXPOSURE: Has there been any exposure to strep within the past week? If Yes, ask: What type of contact occurred?       No  4.  VIRAL SYMPTOMS: Are there any symptoms of [name] cold, such as [name] runny nose, cough, hoarse voice or red eyes?      Cough  5. FEVER: Do you have [name] fever? If Yes, ask: What is your temperature, how was it measured, and when did it start?     No  6. PUS ON THE TONSILS: Is there pus on the tonsils in the back of your throat?     No  7. OTHER SYMPTOMS: Do you have any other symptoms? (e.g., difficulty breathing, headache, rash)     No other symptoms  Protocols used: Sore Throat-[name]-AH

## 2024-06-11 NOTE — Telephone Encounter (Signed)
 FYI Pt has appt with you on 06/12/2024 @10 

## 2024-06-12 ENCOUNTER — Ambulatory Visit (INDEPENDENT_AMBULATORY_CARE_PROVIDER_SITE_OTHER): Admitting: Adult Health

## 2024-06-12 ENCOUNTER — Encounter: Payer: Self-pay | Admitting: Adult Health

## 2024-06-12 VITALS — BP 138/85 | HR 96 | Temp 97.6°F

## 2024-06-12 DIAGNOSIS — J440 Chronic obstructive pulmonary disease with acute lower respiratory infection: Secondary | ICD-10-CM | POA: Diagnosis not present

## 2024-06-12 DIAGNOSIS — J3089 Other allergic rhinitis: Secondary | ICD-10-CM | POA: Diagnosis not present

## 2024-06-12 DIAGNOSIS — J209 Acute bronchitis, unspecified: Secondary | ICD-10-CM

## 2024-06-12 MED ORDER — ALBUTEROL SULFATE HFA 108 (90 BASE) MCG/ACT IN AERS: 1.0000 | INHALATION_SPRAY | Freq: Four times a day (QID) | RESPIRATORY_TRACT | 3 refills | Status: DC | PRN

## 2024-06-12 MED ORDER — PREDNISONE 20 MG PO TABS: 20.0000 mg | ORAL_TABLET | Freq: Every day | ORAL | 0 refills | Status: DC

## 2024-06-12 MED ORDER — BREZTRI AEROSPHERE 160-9-4.8 MCG/ACT IN AERO
2.0000 | INHALATION_SPRAY | Freq: Two times a day (BID) | RESPIRATORY_TRACT | Status: DC
Start: 2024-06-12 — End: 2024-06-20

## 2024-06-12 NOTE — Patient Instructions (Addendum)
 Begin Breztri  2 puffs Twice daily until sample is gone.  Fluids and rest  Tylenol As needed   Prednisone  20mg  daily for 5 days  Zyrtec daily As needed   Albuterol  inhaler or neb As needed   Follow up with Dr. Neysa  as planned and As needed   Please contact office for sooner follow up if symptoms do not improve or worsen or seek emergency care

## 2024-06-12 NOTE — Progress Notes (Signed)
 @Patient  ID: Manuel [name], male    DOB: 1931/08/14, 89 y.o.   MRN: [id number]  Chief Complaint  Patient presents with   Acute Visit    Sore throat/cough    Referring provider: Sophronia Ozell BROCKS, MD  HPI: 89 yo male former smoker followed for COPD with Asthma and Allergic Rhinitis   TEST/EVENTS :   06/12/2024 Acute OV : Cough  Discussed the use of AI scribe software for clinical note transcription with the patient, who gave verbal consent to proceed.  History of Present Illness Manuel [name] is a 89 year old male with asthma and COPD who presents for an acute office visit  with sore throat, congestion, and wheezing.  He has experienced a sore throat and congestion for the past three days, accompanied by mild wheezing. No fever has been noted at home, and he generally feels healthy. He is not on oxygen therapy at home. Appetite is good. No n/v/d. Covid and Flu swab in the office are negative.   He uses an albuterol  inhaler and a nebulizer  He takes Zyrtec, or a generic equivalent, once a day for allergies.  Socially, he remains active, driving and assisting his sons with building projects. He also mows grass for others, which he describes as beneficial for his health. He attends church regularly and feels blessed with his health.    Allergies  Allergen Reactions   Codeine Anaphylaxis   Sulfa Antibiotics Anaphylaxis and Itching   Sulfonamide Derivatives Anaphylaxis   Yeast-Derived Drug Products Rash   Levofloxacin     No fluoroquinolones with aortic aneurysm    Immunization History  Administered Date(s) Administered   Fluad Quad(high Dose 65+) 06/10/2019, 07/15/2021   INFLUENZA, HIGH DOSE SEASONAL PF 06/23/2016, 06/10/2018, 06/10/2019, 06/11/2020   Influenza Split 05/21/2011, 06/19/2012, 07/16/2015, 06/08/2017   Influenza Whole 05/31/2010, 06/19/2012   Influenza, Seasonal, Injecte, Preservative Fre 07/03/2013, 06/09/2014, 07/16/2015, 06/08/2017   Influenza-Unspecified  06/20/2022   Moderna Sars-Covid-2 Vaccination 10/01/2019, 11/01/2019, 05/30/2020   PFIZER(Purple Top)SARS-COV-2 Vaccination 07/25/2020   Pneumococcal Conjugate-13 07/16/2015   Pneumococcal Polysaccharide-23 08/04/2016   Tdap 06/09/2014   Zoster Recombinant(Shingrix) 11/01/2017   Zoster, Live 09/19/2004, 06/08/2017    Past Medical History:  Diagnosis Date   Allergic rhinitis    Asthma    History of CVA (cerebrovascular accident)    HTN (hypertension)    Hyperlipidemia     Tobacco History: Social History   Tobacco Use  Smoking Status Former   Current packs/day: 0.00   Average packs/day: 0.5 packs/day for 4.0 years (2.0 ttl pk-yrs)   Types: Cigarettes   Quit date: 11/25/1951   Years since quitting: 72.5  Smokeless Tobacco Never  Tobacco Comments   Smoked as a teenager. Quit 11/25/1951   Counseling given: Not Answered Tobacco comments: Smoked as a teenager. Quit 11/25/1951   Outpatient Medications Prior to Visit  Medication Sig Dispense Refill   amLODipine (NORVASC) 5 MG tablet Take 5 mg by mouth at bedtime.     azithromycin  (ZITHROMAX ) 250 MG tablet Take 2 the first day and 1 each day after 6 tablet 0   benzonatate  (TESSALON ) 200 MG capsule Take 1 capsule (200 mg total) by mouth 3 (three) times daily as needed for cough. 30 capsule 1   Budeson-Glycopyrrol-Formoterol  (BREZTRI  AEROSPHERE) 160-9-4.8 MCG/ACT AERO Inhale 2 puffs into the lungs in the morning and at bedtime.     cetirizine (ZYRTEC) 10 MG tablet Take 10 mg by mouth every morning.     Cholecalciferol 25 MCG (  1000 UT) capsule Take by mouth.     clopidogrel (PLAVIX) 75 MG tablet Take 1 tablet by mouth daily.     EPINEPHrine  (EPI-PEN) 0.3 mg/0.3 mL SOAJ injection Inject in thigh for severe allergic reaction 1 Device prn   famotidine (PEPCID) 20 MG tablet Take 1 tablet by mouth 2 (two) times daily.     ipratropium-albuterol  (DUONEB) 0.5-2.5 (3) MG/3ML SOLN USE 1 VIAL IN NEBULIZER EVERY 4 HOURS - As Needed 3 mL 11    losartan-hydrochlorothiazide (HYZAAR) 100-12.5 MG per tablet Take 1 tablet by mouth daily.     Multiple Vitamins-Minerals (SENIOR MULTIVITAMIN PLUS PO) Take 1 tablet by mouth daily.     Omega-3 Fatty Acids (FISH OIL) 1000 MG CAPS Take by mouth.     sildenafil (REVATIO) 20 MG tablet TAKE ONE TABLET BY MOUTH EVERY DAY. TAKE NO MORE THAN 3 AT A TIME IF NEEDED     simvastatin (ZOCOR) 40 MG tablet Take 40 mg by mouth at bedtime.     albuterol  (VENTOLIN  HFA) 108 (90 Base) MCG/ACT inhaler INHALE 2 PUFFS BY MOUTH EVERY 4 HOURS AS NEEDED FOR WHEEZING OR SHORTNESS OF BREATH 9 g 1   mometasone -formoterol  (DULERA) 200-5 MCG/ACT AERO 2 puffs then rinse mouth, twice daily 1 Inhaler 12   No facility-administered medications prior to visit.     Review of Systems:   Constitutional:   No  weight loss, night sweats,  Fevers, chills,+ fatigue, or  lassitude.  HEENT:   No headaches,  Difficulty swallowing,  Tooth/dental problems, or  +Sore throat,                No sneezing, itching, ear ache, +nasal congestion, post nasal drip,   CV:  No chest pain,  Orthopnea, PND, swelling in lower extremities, anasarca, dizziness, palpitations, syncope.   GI  No heartburn, indigestion, abdominal pain, nausea, vomiting, diarrhea, change in bowel habits, loss of appetite, bloody stools.   Resp:   No chest wall deformity  Skin: no rash or lesions.  GU: no dysuria, change in color of urine, no urgency or frequency.  No flank pain, no hematuria   MS:  No joint pain or swelling.  No decreased range of motion.  No back pain.    Physical Exam  BP 138/85   Pulse 96   Temp 97.6 F (36.4 C)   SpO2 98%   GEN: A/Ox3; pleasant , NAD, well nourished    HEENT:  /AT, NOSE-clear, THROAT-clear, no lesions, no postnasal drip or exudate noted.   NECK:  Supple w/ fair ROM; no JVD; normal carotid impulses w/o bruits; no thyromegaly or nodules palpated; no lymphadenopathy.    RESP  Clear  P & A; w/o, wheezes/ rales/ or  rhonchi. no accessory muscle use, no dullness to percussion  CARD:  RRR, no m/r/g, no peripheral edema, pulses intact, no cyanosis or clubbing.  GI:   Soft & nt; nml bowel sounds; no organomegaly or masses detected.   Musco: Warm bil, no deformities or joint swelling noted.   Neuro: alert, no focal deficits noted.    Skin: Warm, no lesions or rashes    Lab Results:  CBC   No results found for: BNP  ProBNP No results found for: PROBNP  Imaging: No results found.  Administration History     None           No data to display          No results found for: NITRICOXIDE  Assessment & Plan:   No problem-specific Assessment & Plan notes found for this encounter.   Assessment and Plan Assessment & Plan Acute upper respiratory infection with asthma exacerbation   He has experienced three days of sore throat, congestion, and wheezing, likely due to a viral infection. COVID-19 and Flu swab negative in office today. There is no fever present. Asthma is managed with an albuterol  inhaler and nebulizer, and prednisone  is effective for exacerbations.  Prescribe prednisone  tablets for asthma exacerbation. Refill albuterol  inhaler. Provide Breztri  inhaler sample x 1 week. Prednisone  20mg  daily for 5 days.  Please contact office for sooner follow up if symptoms do not improve or worsen or seek emergency care    Allergic rhinitis   Chronic allergic rhinitis is managed with Zyrtec ([facility]'s brand) with no recent changes in symptoms. Continue Zyrtec for allergic rhinitis management.    Madelin Stank, NP 06/12/2024

## 2024-06-19 NOTE — Progress Notes (Signed)
 HPI male former smoker followed for allergic rhinitis, asthma, complicated by history CVA, hyperlipidemia, HBP Office Spirometry 10/19/2018-WNL, mild restriction of exhaled volume.  FVC 3.0/72%, FEV1 2.4/82%, ratio 1.80, FEF 25-75% 2.5/129% ================================================================    11/03/22- 89 year old male former smoker followed for Allergic rhinitis, Asthma/ COPD, complicated by history CVA, hyperlipidemia, HTN, Allergic Rhinitis, Proair  HFA, neb albuterol , Dulera 200, Zpak to hold Covid vax- 3 Moderna Flu vax- had LOV  08/17/22 Dr Neda Acute visit- > prednisone  20qdx 7d, refilled DuoNeb, N-acetylcysteine. We then sent doxy, Pred8dtaper , tessalon  perles 1/22. -----Pt is doing much better  He feels back at baseline today.  Tries to stay physically active.  No routine phlegm and minimal cough.   11/06/23-  89 year old male former smoker followed for Allergic rhinitis, Asthma/ COPD, complicated by history CVA, hyperlipidemia, HTN, Allergic Rhinitis, Ascending Aortic Aneurysm,  Proair  HFA, neb albuterol , Dulera 200, Zpak to hold Doing very well. Asks Zpak to hold.  His PCP asked I look at [facility] chest CT from December. Report available and reviewed. Images not available.Some RUL small airways inflammation might be an atypical infection, but unlikely to represent any urgent process. We can watch this. Discussed the use of AI scribe software for clinical note transcription with the patient, who gave verbal consent to proceed.  History of Present Illness   The patient, with a history of chronic bronchitis, presents for follow-up after a recent exacerbation. He was prescribed prednisone , a Z-Pak, and an inhaler, which improved his symptoms. He reports intermittent coughing spells, particularly at night, sometimes productive of clear or colored sputum. He denies any associated fever or night sweats. He also reports a history of wheezing at night, which has improved with  the use of an inhaler. He continues using Dulera 200 and is using his albuterol  up to 4x/ day. We will try Breztri  samples.  In addition, the patient had a CT scan in November, which showed fibrotic changes or scarring in the lungs. The patient denies any new respiratory symptoms since the scan.     CT chest ([facility]) 09/08/23 FINDINGS:  Scattered atelectasis or scarring/fibrotic change in bilateral lungs. No overt focal consolidation or infiltrate clearly identified. No large effusion or pneumothorax seen. Central airways are grossly patent. Mild peripheral scattered tree-in-bud nodular changes; series 3 image 131 the right upper lobe as an example.  Upper neck/thoracic inlet is without definite acute abnormality. No definite supraclavicular or axillary adenopathy identified. Heart size is stable. No large pericardial effusion seen. Ascending aorta is mildly aneurysmal measuring 4.1 cm. Moderate to severe coronary calcifications. Shotty lymph nodes in the mediastinum/hilar regions. Small hiatal hernia. Please see same-day CT abdomen for details. Degenerative osseous changes. No definite displaced fracture seen.  IMPRESSION:  1. Mild scattered right upper lobe peripheral tree-in-bud nodularity changes; this could be from early infiltrate.  2.  Scattered atelectasis or scarring/chronic appearing interstitial/fibrotic change. No focal large consolidation or infiltrate seen.   Assessment and Plan:    Chronic Bronchitis Persistent cough, especially at night, with occasional production of clear or colored sputum. No associated night sweats or fevers. Lung exam revealed slight raspy sounds. Currently on Dulera inhaler and nebulizer with albuterol  and ipratropium. -Continue Dulera inhaler, two puffs twice a day, and nebulizer as needed. -Trial of Breztri  inhaler (sample provided) to assess if additional ingredient improves symptoms. Patient to provide feedback on effectiveness.  Lung Nodule CT scan  from November reports fibrotic changes and possible early pneumonia at that time. No current symptoms suggestive of pneumonia.  No further action required at this time.  Follow-up in 6 months or sooner if needed.      06/20/24- 89 year old male former smoker followed for Allergic rhinitis, Asthma/ COPD, ILD , complicated by history CVA, hyperlipidemia, HTN, Allergic Rhinitis, Ascending Aortic Aneurysm,  Proair  HFA, neb albuterol , Dulera 200, Zpak to hold Saw NP on 9/24 for acute URI> prednisone  x 5 days, Breztri  sample x 1. Discussed the use of AI scribe software for clinical note transcription with the patient, who gave verbal consent to proceed.  History of Present Illness   Manuel [name] is a 89 year old male withAsthma// COPD and some fibrosis who presents with a persistent cough and respiratory issues.  He experiences a persistent, nagging cough that has improved but continues. Last week, wheezing was noted. He has pulmonary fibrosis, identified in a CT scan conducted at [facility] in December. He uses Dulera, which he prefers over Breztri ,  and over-the-counter cough syrups like Delsym. He doesn't remember Tessalon / benzonatate  pearls for cough management and is open to using them again. His respiratory problem is his biggest health concern.     Assessment and Plan:    Asthma/ COPD Pulmonary fibrosis noted on CT at [facility], with chronic cough and wheezing Chronic cough and wheezing with crackles in the lower left lung. Previous CT showed fibrosis. Concern for progression. - Order chest x-ray today to assess lung status. - Consider when to update CT scan if x-ray inconclusive. - Prescribe Tessalon  pearls for cough. - Recommend Delsym for cough. - Allow concurrent use of Tessalon  pearls and Delsym.  Asthma - Refill Dulera prescription for one year at [facility] on [address].     ROS-see HPI   + = positive Constitutional:   No-   weight loss, night sweats, fevers, chills, fatigue,  lassitude. HEENT:   No-  headaches, difficulty swallowing, tooth/dental problems, sore throat,       No-  sneezing, itching, ear ache,  recent nasal congestion, post nasal drip,  CV:  No-   chest pain, orthopnea, PND, swelling in lower extremities, anasarca,  dizziness, palpitations Resp: No- acute shortness of breath with exertion or at rest.              +  productive cough, + non-productive cough,  No- coughing up of blood.              No-   change in color of mucus.  No- wheezing.   Skin: No-   rash or lesions. GI:  No-   heartburn, indigestion, abdominal pain, nausea, vomiting,  GU: MS:  No-   joint pain or swelling.   Neuro-     nothing unusual Psych:  No- change in mood or affect. No depression or anxiety.  No memory loss.  OBJ General- Alert, Oriented, Affect-appropriate, Distress- none acute. Very sharp.  Skin- rash-none, lesions- none, excoriation- none Lymphadenopathy- none Head- atraumatic            Eyes- Gross vision intact, PERRLA, conjunctivae clear secretions            Ears- Hearing, canals-normal            Nose- Clear, no-Septal dev, mucus, polyps, erosion, perforation             Throat- Mallampati II , mucosa red , drainage- none, tonsils- atrophic Neck- flexible , trachea midline, no stridor , thyroid nl, carotid no bruit Chest - symmetrical excursion , unlabored  Heart/CV- RRR ,  murmur , no gallop  , no rub, nl s1 s2                           - JVD- none , edema- none, stasis changes- none, varices- none           Lung- + crackles L base,  wheeze- none, cough- none , dullness-none, rub- none           Chest wall-  Abd-  Br/ Gen/ Rectal- Not done, not indicated Extrem- cyanosis- none, clubbing, none, atrophy- none, strength- nl Neuro- grossly intact to observation

## 2024-06-20 ENCOUNTER — Encounter: Payer: Self-pay | Admitting: Internal Medicine

## 2024-06-20 ENCOUNTER — Ambulatory Visit (INDEPENDENT_AMBULATORY_CARE_PROVIDER_SITE_OTHER): Admitting: Internal Medicine

## 2024-06-20 ENCOUNTER — Ambulatory Visit (INDEPENDENT_AMBULATORY_CARE_PROVIDER_SITE_OTHER)

## 2024-06-20 VITALS — BP 122/64 | HR 58 | Temp 97.8°F | Ht 73.0 in | Wt 188.0 lb

## 2024-06-20 DIAGNOSIS — Z87891 Personal history of nicotine dependence: Secondary | ICD-10-CM | POA: Diagnosis not present

## 2024-06-20 DIAGNOSIS — R053 Chronic cough: Secondary | ICD-10-CM

## 2024-06-20 DIAGNOSIS — J4489 Other specified chronic obstructive pulmonary disease: Secondary | ICD-10-CM

## 2024-06-20 DIAGNOSIS — J8410 Pulmonary fibrosis, unspecified: Secondary | ICD-10-CM

## 2024-06-20 MED ORDER — MOMETASONE FURO-FORMOTEROL FUM 100-5 MCG/ACT IN AERO: INHALATION_SPRAY | RESPIRATORY_TRACT | 12 refills | Status: DC

## 2024-06-20 MED ORDER — BENZONATATE 200 MG PO CAPS: 200.0000 mg | ORAL_CAPSULE | Freq: Three times a day (TID) | ORAL | 2 refills | Status: DC | PRN

## 2024-06-20 NOTE — Patient Instructions (Signed)
 Script sent refilling Dulera inhaler  Script sent for Tessalon  (benzonatate ) perles- 1, 3 times daily if needed for cough  You can also use Delsym cough syrup for cough  Order- CXR      dx persistent cough

## 2024-06-21 ENCOUNTER — Ambulatory Visit: Payer: Self-pay | Admitting: Internal Medicine

## 2024-06-24 ENCOUNTER — Telehealth: Payer: Self-pay

## 2024-06-24 ENCOUNTER — Other Ambulatory Visit (HOSPITAL_COMMUNITY): Payer: Self-pay

## 2024-06-24 MED ORDER — AZITHROMYCIN 250 MG PO TABS: ORAL_TABLET | ORAL | 0 refills | Status: AC

## 2024-06-24 NOTE — Telephone Encounter (Signed)
 Patient seen on 06/20/2024.  Patient would like Dr. Neysa to call in a Z-Pak to have on hand at home.  Dr. Neysa, please advise.  Thank you.  Allergies  Allergen Reactions   Codeine Anaphylaxis   Sulfa Antibiotics Anaphylaxis and Itching   Sulfonamide Derivatives Anaphylaxis   Yeast-Derived Drug Products Rash   Levofloxacin     No fluoroquinolones with aortic aneurysm

## 2024-06-24 NOTE — Telephone Encounter (Signed)
 Notified patient.

## 2024-06-24 NOTE — Telephone Encounter (Signed)
 Zpak sent to his drug store

## 2024-06-24 NOTE — Telephone Encounter (Signed)
 Patient states [facility] sent in on last OV 06/20/2024 will cost over $300.  Please advise what inhaler is on formulary for patient.  Thank you.

## 2024-06-24 NOTE — Telephone Encounter (Signed)
 Dr. Neysa, this is cost of alternative inhaler.  Patient cannot afford Dulera at $300.  Do you want to change to generic Advair diskus at $91.00?  We need to ask the patient if this cost is also too much for patient to afford.  Thank you.

## 2024-06-25 NOTE — Telephone Encounter (Signed)
 Please ask patient if he is willing to replace [facility], which he finds too expensive, with the  Advair 250 diskus generic, 1 inhalation then rinse mouth, twice daily, refill prn, based on this price difference. Thanks.

## 2024-06-26 NOTE — Telephone Encounter (Signed)
 I called and spoke with the pt and notified of response from Dr. Neysa. He states that he prefers to stick with what he is currently taking, and declined the change to advair. Nothing further needed per pt at this time.

## 2024-08-30 ENCOUNTER — Other Ambulatory Visit: Payer: Self-pay | Admitting: Internal Medicine

## 2024-09-09 NOTE — Progress Notes (Signed)
 HPI male former smoker followed for allergic rhinitis, asthma, complicated by history CVA, hyperlipidemia, HBP Office Spirometry 10/19/2018-WNL, mild restriction of exhaled volume.  FVC 3.0/72%, FEV1 2.4/82%, ratio 1.80, FEF 25-75% 2.5/129% ================================================================ .      06/20/24- 89 year old male former smoker followed for Allergic rhinitis, Asthma/ COPD, ILD , complicated by history CVA, hyperlipidemia, HTN, Allergic Rhinitis, Ascending Aortic Aneurysm,  Proair  HFA, neb albuterol , Dulera 200, Zpak to hold Saw NP on 9/24 for acute URI> prednisone  x 5 days, Breztri  sample x 1. Discussed the use of AI scribe software for clinical note transcription with the patient, who gave verbal consent to proceed.  History of Present Illness   Manuel [name] is a 89 year old male withAsthma// COPD and some fibrosis who presents with a persistent cough and respiratory issues.  He experiences a persistent, nagging cough that has improved but continues. Last week, wheezing was noted. He has pulmonary fibrosis, identified in a CT scan conducted at [facility] in December. He uses Dulera, which he prefers over Breztri ,  and over-the-counter cough syrups like Delsym. He doesn't remember Tessalon / benzonatate  pearls for cough management and is open to using them again. His respiratory problem is his biggest health concern.     Assessment and Plan:    Asthma/ COPD Pulmonary fibrosis noted on CT at [facility], with chronic cough and wheezing Chronic cough and wheezing with crackles in the lower left lung. Previous CT showed fibrosis. Concern for progression. - Order chest x-ray today to assess lung status. - Consider when to update CT scan if x-ray inconclusive. - Prescribe Tessalon  pearls for cough. - Recommend Delsym for cough. - Allow concurrent use of Tessalon  pearls and Delsym.  Asthma - Refill Dulera prescription for one year at [facility] on [address].     09/10/24- 89 year old male former smoker followed for Allergic rhinitis, Asthma/ COPD, ILD , complicated by history CVA, hyperlipidemia, HTN, Allergic Rhinitis, Ascending Aortic Aneurysm,  Proair  HFA, neb albuterol ,  Zpak to hold   No longer using Dulera. Cough Discussed the use of AI scribe software for clinical note transcription with the patient, who gave verbal consent to proceed.  History of Present Illness   Manuel [name] is a 89 year old male with chronic respiratory issues who presents with persistent cough and breathing difficulties.  He has long-standing cough and dyspnea that persist despite current therapy. He uses albuterol  via nebulizer three times daily and an inhaler three times daily for symptom control.  He previously used Dulera as a maintenance inhaler with good effect. He stopped it when switched to a different, unaffordable inhaler and now requests to restart Dulera.  He focuses his medications on respiratory treatment. He reports a recent chest x-ray that was read as normal. He is motivated to optimize his breathing to maintain his independence.     CXR 06/20/24 IMPRESSION: No acute cardiopulmonary abnormality.  Assessment and Plan:    Interstitial lung disease suspected Suspected interstitial lung disease due to auscultated crackles. Chest x-ray unremarkable, further evaluation with CT scan required. Small airways disease or edema could sound the same. At his age we likely would not treat ILD.SABRA - Ordered chest CT without contrast. - Scheduled follow-up in three months to review CT results.  Chronic obstructive pulmonary disease Chronic cough and respiratory issues. Current treatment includes nebulizer and inhaler. Plan to reintroduce Dulera for its corticosteroid benefits. - Refilled inhalers, including Dulera. - Prescribed azithromycin  for potential respiratory infections.     ROS-see HPI   + =  positive Constitutional:   No-   weight loss, night sweats,  fevers, chills, fatigue, lassitude. HEENT:   No-  headaches, difficulty swallowing, tooth/dental problems, sore throat,       No-  sneezing, itching, ear ache,  recent nasal congestion, post nasal drip,  CV:  No-   chest pain, orthopnea, PND, swelling in lower extremities, anasarca,  dizziness, palpitations Resp: No- acute shortness of breath with exertion or at rest.              +  productive cough, + non-productive cough,  No- coughing up of blood.              No-   change in color of mucus.  No- wheezing.   Skin: No-   rash or lesions. GI:  No-   heartburn, indigestion, abdominal pain, nausea, vomiting,  GU: MS:  No-   joint pain or swelling.   Neuro-     nothing unusual Psych:  No- change in mood or affect. No depression or anxiety.  No memory loss.  OBJ General- Alert, Oriented, Affect-appropriate, Distress- none acute. Very sharp.  Skin- rash-none, lesions- none, excoriation- none Lymphadenopathy- none Head- atraumatic            Eyes- Gross vision intact, PERRLA, conjunctivae clear secretions            Ears- Hearing, canals-normal            Nose- Clear, no-Septal dev, mucus, polyps, erosion, perforation             Throat- Mallampati II , mucosa red , drainage- none, tonsils- atrophic Neck- flexible , trachea midline, no stridor , thyroid nl, carotid no bruit Chest - symmetrical excursion , unlabored           Heart/CV- RRR ,  murmur , no gallop  , no rub, nl s1 s2                           - JVD- none , edema- none, stasis changes- none, varices- none           Lung- + crackles L>R base,  wheeze- none, cough- none , dullness-none, rub- none           Chest wall-  Abd-  Br/ Gen/ Rectal- Not done, not indicated Extrem- cyanosis- none, clubbing, none, atrophy- none, strength- nl Neuro- grossly intact to observation

## 2024-09-10 ENCOUNTER — Ambulatory Visit: Admitting: Internal Medicine

## 2024-09-10 ENCOUNTER — Encounter: Payer: Self-pay | Admitting: Internal Medicine

## 2024-09-10 VITALS — BP 122/58 | HR 63 | Temp 97.4°F | Ht 73.0 in | Wt 190.8 lb

## 2024-09-10 DIAGNOSIS — J849 Interstitial pulmonary disease, unspecified: Secondary | ICD-10-CM

## 2024-09-10 DIAGNOSIS — Z87891 Personal history of nicotine dependence: Secondary | ICD-10-CM | POA: Diagnosis not present

## 2024-09-10 DIAGNOSIS — R053 Chronic cough: Secondary | ICD-10-CM

## 2024-09-10 DIAGNOSIS — J449 Chronic obstructive pulmonary disease, unspecified: Secondary | ICD-10-CM

## 2024-09-10 MED ORDER — MOMETASONE FURO-FORMOTEROL FUM 200-5 MCG/ACT IN AERO: INHALATION_SPRAY | RESPIRATORY_TRACT | 12 refills | Status: AC

## 2024-09-10 MED ORDER — AZITHROMYCIN 250 MG PO TABS: ORAL_TABLET | ORAL | 0 refills | Status: AC

## 2024-09-10 MED ORDER — ALBUTEROL SULFATE HFA 108 (90 BASE) MCG/ACT IN AERS: 2.0000 | INHALATION_SPRAY | RESPIRATORY_TRACT | 12 refills | Status: AC | PRN

## 2024-09-10 NOTE — Patient Instructions (Signed)
 Order- schedule HRCT chest, no contrast    dx Interstitial lung disease, cough  Scripts sent for albuterol  inhaler and dulera maintenance inhaler  Schedule return in 3 months with Dr   Zola Herter

## 2024-09-18 ENCOUNTER — Ambulatory Visit
Admission: RE | Admit: 2024-09-18 | Discharge: 2024-09-18 | Disposition: A | Discharge status: Home / Self Care | Source: Ambulatory Visit | Attending: Internal Medicine | Admitting: Internal Medicine

## 2024-09-18 DIAGNOSIS — J849 Interstitial pulmonary disease, unspecified: Secondary | ICD-10-CM

## 2024-09-25 ENCOUNTER — Telehealth: Payer: Self-pay | Admitting: *Deleted

## 2024-09-25 NOTE — Telephone Encounter (Signed)
 Patient inquiring about results of CT scan ordered by CY. He will f/u in March with Dr. Donzetta.   Copied from CRM [id number]. Topic: Clinical - Lab/Test Results >> Sep 24, 2024  8:39 AM Manuel [name] wrote: Reason for CRM: Patient would like to discuss his CT results.   Callback number: [phone]

## 2024-09-26 ENCOUNTER — Other Ambulatory Visit (HOSPITAL_COMMUNITY): Payer: Self-pay

## 2024-09-26 ENCOUNTER — Telehealth: Payer: Self-pay | Admitting: *Deleted

## 2024-09-26 ENCOUNTER — Telehealth: Payer: Self-pay

## 2024-09-26 NOTE — Telephone Encounter (Signed)
 Pharmacy team can we investigate which inhaler plan covers?  Copied from CRM [id number]. Topic: Clinical - Prescription Issue >> Sep 24, 2024  8:41 AM Isabell A wrote: Reason for CRM: Patient states he was prescribed an inhaler but it cost $300+ and he did not pick it up - he does not know the name of the inhaler, states his current inhaler works just fine.   Callback number: [phone]

## 2024-09-26 NOTE — Telephone Encounter (Signed)
 Test claims at this time show:   Dulera: $344.04 Brand Breo Ellipta: $408.78 Generic Advair Diskus: $87.69 Brand Symbicort: $235.50  *patient showing a deductible to meet before prices go down

## 2024-09-26 NOTE — Telephone Encounter (Signed)
 Dulera is what he states cost $300. Is there an alternative?

## 2024-09-26 NOTE — Telephone Encounter (Signed)
 dr theophilus patient he was suppose to order some small oxygen tanks . wanted to see did he put the order in for me . Did relay results to patient and he said ok

## 2024-09-30 NOTE — Telephone Encounter (Signed)
 Called patient and made aware of inhaler options. He will hold off at this time. NFN

## 2024-09-30 NOTE — Telephone Encounter (Signed)
 Duplicate message. NFN

## 2024-09-30 NOTE — Telephone Encounter (Signed)
 Patient does not where 02 he only used nebulizer and albuterol  inhaler. NFN

## 2024-12-10 ENCOUNTER — Ambulatory Visit
